# Patient Record
Sex: Female | Born: 1960 | Race: White | Hispanic: No | Marital: Married | Smoking: Former smoker
Health system: Southern US, Community
[De-identification: ages and names within clinical notes are randomized; demographics above are authoritative.]

## PROBLEM LIST (undated history)

## (undated) DIAGNOSIS — E66812 Obesity, class 2: Secondary | ICD-10-CM

## (undated) DIAGNOSIS — Z86010 Personal history of colonic polyps: Secondary | ICD-10-CM

## (undated) DIAGNOSIS — K219 Gastro-esophageal reflux disease without esophagitis: Secondary | ICD-10-CM

## (undated) DIAGNOSIS — E669 Obesity, unspecified: Secondary | ICD-10-CM

## (undated) DIAGNOSIS — Z860101 Personal history of adenomatous and serrated colon polyps: Secondary | ICD-10-CM

## (undated) DIAGNOSIS — E7800 Pure hypercholesterolemia, unspecified: Secondary | ICD-10-CM

## (undated) DIAGNOSIS — I82409 Acute embolism and thrombosis of unspecified deep veins of unspecified lower extremity: Secondary | ICD-10-CM

## (undated) DIAGNOSIS — E119 Type 2 diabetes mellitus without complications: Secondary | ICD-10-CM

## (undated) DIAGNOSIS — J45909 Unspecified asthma, uncomplicated: Secondary | ICD-10-CM

## (undated) HISTORY — DX: Personal history of colonic polyps: Z86.010

## (undated) HISTORY — PX: POLYPECTOMY: SHX149

## (undated) HISTORY — DX: Personal history of adenomatous and serrated colon polyps: Z86.0101

## (undated) HISTORY — DX: Unspecified asthma, uncomplicated: J45.909

## (undated) HISTORY — DX: Obesity, unspecified: E66.9

## (undated) HISTORY — DX: Obesity, class 2: E66.812

## (undated) HISTORY — PX: WISDOM TOOTH EXTRACTION: SHX21

## (undated) HISTORY — PX: KNEE ARTHROSCOPY: SUR90

## (undated) HISTORY — DX: Gastro-esophageal reflux disease without esophagitis: K21.9

## (undated) HISTORY — PX: CARPAL TUNNEL RELEASE: SHX101

## (undated) HISTORY — DX: Type 2 diabetes mellitus without complications: E11.9

## (undated) HISTORY — DX: Acute embolism and thrombosis of unspecified deep veins of unspecified lower extremity: I82.409

## (undated) HISTORY — PX: COLONOSCOPY: SHX174

## (undated) HISTORY — DX: Pure hypercholesterolemia, unspecified: E78.00

---

## 1999-04-10 ENCOUNTER — Other Ambulatory Visit: Admission: RE | Admit: 1999-04-10 | Discharge: 1999-04-10 | Payer: Self-pay | Admitting: Obstetrics and Gynecology

## 2000-08-06 ENCOUNTER — Other Ambulatory Visit: Admission: RE | Admit: 2000-08-06 | Discharge: 2000-08-06 | Payer: Self-pay | Admitting: Obstetrics and Gynecology

## 2001-06-04 ENCOUNTER — Ambulatory Visit (HOSPITAL_BASED_OUTPATIENT_CLINIC_OR_DEPARTMENT_OTHER): Admission: RE | Admit: 2001-06-04 | Discharge: 2001-06-04 | Payer: Self-pay | Admitting: Orthopedic Surgery

## 2002-03-16 ENCOUNTER — Other Ambulatory Visit: Admission: RE | Admit: 2002-03-16 | Discharge: 2002-03-16 | Payer: Self-pay | Admitting: Obstetrics and Gynecology

## 2009-05-12 ENCOUNTER — Ambulatory Visit: Payer: Self-pay | Admitting: Vascular Surgery

## 2010-01-22 DIAGNOSIS — I82409 Acute embolism and thrombosis of unspecified deep veins of unspecified lower extremity: Secondary | ICD-10-CM

## 2010-01-22 HISTORY — DX: Acute embolism and thrombosis of unspecified deep veins of unspecified lower extremity: I82.409

## 2010-06-09 NOTE — Op Note (Signed)
[place]  Patient:    YAHAIRA, BRUSKI Visit Number: [id number] MRN: [id number]          Service Type: DSU Location: [facility] Attending Physician:  Marlowe Shores Dictated by:   Artist Pais Mina Marble, M.D. Proc. Date: 06/04/01 Admit Date:  06/04/2001                             Operative Report  PREOPERATIVE DIAGNOSIS:  Right carpal tunnel syndrome.  POSTOPERATIVE DIAGNOSIS:  Right carpal tunnel syndrome.  PROCEDURE:  Right carpal tunnel release.  SURGEON:  Artist Pais. Mina Marble, M.D.  ANESTHESIA:  General.  TOURNIQUET TIME:  10 minutes.  COMPLICATIONS:  None.  DRAINS:  None.  DESCRIPTION OF PROCEDURE:  The patient was taken to the operating room, where after the induction of adequate general anesthesia the right upper extremity was prepped and draped in the usual sterile fashion.  An Esmarch was used to exsanguinate the limb and the tourniquet was inflated to 250 mmHg.  At this point in time a 2 cm incision was made in the palmar aspect of the right hand in line with the long finger metacarpal starting at _____ cardinal line.  The incision was taken down through the skin and subcutaneous tissues until the palmar fascia was identified.  The palmar fascia was split longitudinally, thus exposing the superficial palmar arch and the transverse carpal ligament. The palmar arch was retracted distally with a Ragnell retractor, and the distal edge of the transverse carpal ligament was divided with a 15 blade to expose the underlying median nerve and the contents of the carpal canal.  Next using a Therapist, nutritional, a path was created dorsal and volar to the remaining aspect of the transverse carpal ligament.  This was then divided under direct vision using scissors.  After it was decompressed, the wound was thoroughly irrigated.  Hemostasis was achieved with bipolar cautery.  It was closed with a running 3-0 Prolene subcuticular stitch.   Steri-Strips, 4 x 4s, fluffs, a compressive hand dressing were applied.  The patient tolerated the procedure well, went to the recovery room in stable fashion. Dictated by:   Artist Pais Mina Marble, M.D. Attending Physician:  Marlowe Shores DD:  06/04/01 TD:  06/05/01 Job: [id number]

## 2011-10-12 ENCOUNTER — Other Ambulatory Visit: Payer: Self-pay | Admitting: Obstetrics and Gynecology

## 2011-10-12 DIAGNOSIS — N6310 Unspecified lump in the right breast, unspecified quadrant: Secondary | ICD-10-CM

## 2011-10-15 ENCOUNTER — Other Ambulatory Visit: Payer: Self-pay

## 2011-10-16 ENCOUNTER — Ambulatory Visit
Admission: RE | Admit: 2011-10-16 | Discharge: 2011-10-16 | Disposition: A | Discharge status: Home / Self Care | Payer: 59 | Source: Ambulatory Visit | Attending: Obstetrics and Gynecology | Admitting: Obstetrics and Gynecology

## 2011-10-16 DIAGNOSIS — N6310 Unspecified lump in the right breast, unspecified quadrant: Secondary | ICD-10-CM

## 2012-01-23 HISTORY — PX: COLONOSCOPY: SHX174

## 2012-05-19 ENCOUNTER — Encounter: Payer: Self-pay | Admitting: Internal Medicine

## 2012-06-26 ENCOUNTER — Ambulatory Visit (AMBULATORY_SURGERY_CENTER): Payer: BC Managed Care – PPO | Admitting: *Deleted

## 2012-06-26 ENCOUNTER — Telehealth: Payer: Self-pay | Admitting: *Deleted

## 2012-06-26 ENCOUNTER — Encounter: Payer: Self-pay | Admitting: Internal Medicine

## 2012-06-26 VITALS — Ht 61.0 in | Wt 210.8 lb

## 2012-06-26 DIAGNOSIS — Z86010 Personal history of colonic polyps: Secondary | ICD-10-CM

## 2012-06-26 DIAGNOSIS — Z1211 Encounter for screening for malignant neoplasm of colon: Secondary | ICD-10-CM

## 2012-06-26 MED ORDER — MOVIPREP 100 G PO SOLR: 1.0000 | Freq: Once | ORAL | Status: DC

## 2012-06-26 NOTE — Progress Notes (Signed)
No egg or soy allergy. ewm No home oxygen use. ewm No problems with past sedations. ewm Pt states did have a colonoscopy 10 years ago in [place]. She did have polyps removed. Will obtain records. ewm

## 2012-06-26 NOTE — Telephone Encounter (Signed)
Patient states she had a colonoscopy 10 years ago at [place] [facility] with a doctor she cant remember the name of. She states she had several polyps removed at this colon and we will need records for dr pyrtle. Pt has a colon scheduled for Thursday 07-10-2012 at 0830 with dr pyrtle. Records release signed at [facility] today 06-26-12 and taken to stacy howald CMA. EWM,RN

## 2012-07-10 ENCOUNTER — Ambulatory Visit (AMBULATORY_SURGERY_CENTER): Payer: BC Managed Care – PPO | Admitting: Internal Medicine

## 2012-07-10 ENCOUNTER — Encounter: Payer: Self-pay | Admitting: Internal Medicine

## 2012-07-10 VITALS — BP 130/80 | HR 61 | Temp 98.1°F | Resp 17 | Ht 61.0 in | Wt 210.0 lb

## 2012-07-10 DIAGNOSIS — D126 Benign neoplasm of colon, unspecified: Secondary | ICD-10-CM

## 2012-07-10 DIAGNOSIS — Z86010 Personal history of colonic polyps: Secondary | ICD-10-CM

## 2012-07-10 DIAGNOSIS — Z1211 Encounter for screening for malignant neoplasm of colon: Secondary | ICD-10-CM

## 2012-07-10 MED ORDER — SODIUM CHLORIDE 0.9 % IV SOLN: 500.0000 mL | INTRAVENOUS | Status: DC

## 2012-07-10 NOTE — Progress Notes (Signed)
Lidocaine-40mg IV prior to Propofol InductionPropofol given over incremental dosages 

## 2012-07-10 NOTE — Progress Notes (Signed)
Patient did not experience any of the following events: a burn prior to discharge; a fall within the facility; wrong site/side/patient/procedure/implant event; or a hospital transfer or hospital admission upon discharge from the facility. (G8907) Patient did not have preoperative order for IV antibiotic SSI prophylaxis. (G8918)  

## 2012-07-10 NOTE — Patient Instructions (Addendum)
Discharge instructions given with verbal understanding. Handout on polyps given. Resume previous medications. YOU HAD AN ENDOSCOPIC PROCEDURE TODAY AT THE [facility]: Refer to the procedure report that was given to you for any specific questions about what was found during the examination.  If the procedure report does not answer your questions, please call your gastroenterologist to clarify.  If you requested that your care partner not be given the details of your procedure findings, then the procedure report has been included in a sealed envelope for you to review at your convenience later.  YOU SHOULD EXPECT: Some feelings of bloating in the abdomen. Passage of more gas than usual.  Walking can help get rid of the air that was put into your GI tract during the procedure and reduce the bloating. If you had a lower endoscopy (such as a colonoscopy or flexible sigmoidoscopy) you may notice spotting of blood in your stool or on the toilet paper. If you underwent a bowel prep for your procedure, then you may not have a normal bowel movement for a few days.  DIET: Your first meal following the procedure should be a light meal and then it is ok to progress to your normal diet.  A half-sandwich or bowl of soup is an example of a good first meal.  Heavy or fried foods are harder to digest and may make you feel nauseous or bloated.  Likewise meals heavy in dairy and vegetables can cause extra gas to form and this can also increase the bloating.  Drink plenty of fluids but you should avoid alcoholic beverages for 24 hours.  ACTIVITY: Your care partner should take you home directly after the procedure.  You should plan to take it easy, moving slowly for the rest of the day.  You can resume normal activity the day after the procedure however you should NOT DRIVE or use heavy machinery for 24 hours (because of the sedation medicines used during the test).    SYMPTOMS TO REPORT IMMEDIATELY: A  gastroenterologist can be reached at any hour.  During normal business hours, 8:30 AM to 5:00 PM Monday through Friday, call [phone].  After hours and on weekends, please call the GI answering service at [phone] who will take a message and have the physician on call contact you.   Following lower endoscopy (colonoscopy or flexible sigmoidoscopy):  Excessive amounts of blood in the stool  Significant tenderness or worsening of abdominal pains  Swelling of the abdomen that is new, acute  Fever of 100F or higher  FOLLOW UP: If any biopsies were taken you will be contacted by phone or by letter within the next 1-3 weeks.  Call your gastroenterologist if you have not heard about the biopsies in 3 weeks.  Our staff will call the home number listed on your records the next business day following your procedure to check on you and address any questions or concerns that you may have at that time regarding the information given to you following your procedure. This is a courtesy call and so if there is no answer at the home number and we have not heard from you through the emergency physician on call, we will assume that you have returned to your regular daily activities without incident.  SIGNATURES/CONFIDENTIALITY: You and/or your care partner have signed paperwork which will be entered into your electronic medical record.  These signatures attest to the fact that that the information above on your After Visit Summary has   been reviewed and is understood.  Full responsibility of the confidentiality of this discharge information lies with you and/or your care-partner. 

## 2012-07-10 NOTE — Telephone Encounter (Signed)
Pt had colon 07-10-12 with dr Rhea Belton. ewm

## 2012-07-10 NOTE — Op Note (Signed)
[facility] [address] [place], [address]   COLONOSCOPY PROCEDURE REPORT  PATIENT: Ernest, Orr  MR#: [id number] BIRTHDATE: [birth year] , 51  yrs. old GENDER: Female ENDOSCOPIST: Beverley Fiedler, MD REFERRED MW:[id number] Tisovec, M.D. PROCEDURE DATE:  07/10/2012 PROCEDURE:   Colonoscopy with snare polypectomy ASA CLASS:   Class II INDICATIONS:elevated risk screening and Last colonoscopy performed 10 years ago. MEDICATIONS: MAC sedation, administered by CRNA and propofol (Diprivan) 250mg  IV  DESCRIPTION OF PROCEDURE:   After the risks benefits and alternatives of the procedure were thoroughly explained, informed consent was obtained.  A digital rectal exam revealed no rectal mass.   The LB [id number]  endoscope was introduced through the anus and advanced to the terminal ileum which was intubated for a short distance. No adverse events experienced.   The quality of the prep was good, using MoviPrep  The instrument was then slowly withdrawn as the colon was fully examined.    COLON FINDINGS: The mucosa appeared normal in the terminal ileum. Five sessile polyps measuring 3-6 mm in size were found in the ascending colon (2), transverse colon (1), descending colon (1), and sigmoid colon (1).  Polypectomy was performed using cold snare. All resections were complete and all polyp tissue was completely retrieved.  Retroflexed views revealed external hemorrhoids. The time to cecum=2 minutes 08 seconds.  Withdrawal time=14 minutes 52 seconds.  The scope was withdrawn and the procedure completed  COMPLICATIONS: There were no complications.  ENDOSCOPIC IMPRESSION: 1.   Normal mucosa in the terminal ileum 2.   Five sessile polyps measuring 3-6 mm in size were found in the ascending colon, transverse colon, descending colon, and sigmoid colon; Polypectomy was performed using cold snare RECOMMENDATIONS: 1.  Await pathology results 2.  Hold aspirin,  aspirin products, and anti-inflammatory medication for 1 week. 3.  Timing of repeat colonoscopy will be determined by pathology findings (if all polyps adenomatous, then repeat in 3 years). 4.  You will receive a letter within 1-2 weeks with the results of your biopsy as well as final recommendations.  Please call my office if you have not received a letter after 3 weeks.   eSigned:  Beverley Fiedler, MD 07/10/2012 9:01 AM   cc: The Patient and Guerry Bruin, MD   PATIENT NAME:  Shiori, Adcox MR#: [id number]

## 2012-07-10 NOTE — Progress Notes (Signed)
Called to room to assist during endoscopic procedure.  Patient ID and intended procedure confirmed with present staff. Received instructions for my participation in the procedure from the performing physician.Called to room to assist during endoscopic procedure.  Patient ID and intended procedure confirmed with present staff. Received instructions for my participation in the procedure from the performing physician. 

## 2012-07-11 ENCOUNTER — Telehealth: Payer: Self-pay

## 2012-07-11 NOTE — Telephone Encounter (Signed)
  Follow up Call-  Call back number 07/10/2012  Post procedure Call Back phone  # [phone]  Permission to leave phone message Yes     Patient questions:  Do you have a fever, pain , or abdominal swelling? no Pain Score  0 *  Have you tolerated food without any problems? yes  Have you been able to return to your normal activities? yes  Do you have any questions about your discharge instructions: Diet   no Medications  no Follow up visit  no  Do you have questions or concerns about your Care? no  Actions: * If pain score is 4 or above: No action needed, pain <4.

## 2012-07-16 ENCOUNTER — Encounter: Payer: Self-pay | Admitting: Internal Medicine

## 2012-12-02 ENCOUNTER — Encounter: Payer: Self-pay | Admitting: Internal Medicine

## 2012-12-02 NOTE — Progress Notes (Signed)
Faxed fmla form to [place] [facility] [place] @ [phone].

## 2013-01-20 ENCOUNTER — Other Ambulatory Visit: Payer: Self-pay | Admitting: Obstetrics and Gynecology

## 2013-01-20 DIAGNOSIS — R928 Other abnormal and inconclusive findings on diagnostic imaging of breast: Secondary | ICD-10-CM

## 2013-02-03 ENCOUNTER — Ambulatory Visit
Admission: RE | Admit: 2013-02-03 | Discharge: 2013-02-03 | Disposition: A | Discharge status: Home / Self Care | Payer: BC Managed Care – PPO | Source: Ambulatory Visit | Attending: Obstetrics and Gynecology | Admitting: Obstetrics and Gynecology

## 2013-02-03 DIAGNOSIS — R928 Other abnormal and inconclusive findings on diagnostic imaging of breast: Secondary | ICD-10-CM

## 2013-06-29 DIAGNOSIS — J45909 Unspecified asthma, uncomplicated: Secondary | ICD-10-CM | POA: Insufficient documentation

## 2013-08-19 ENCOUNTER — Encounter: Payer: Self-pay | Admitting: Podiatrist

## 2013-08-19 ENCOUNTER — Ambulatory Visit (INDEPENDENT_AMBULATORY_CARE_PROVIDER_SITE_OTHER): Payer: BC Managed Care – PPO | Admitting: Podiatrist

## 2013-08-19 ENCOUNTER — Ambulatory Visit (INDEPENDENT_AMBULATORY_CARE_PROVIDER_SITE_OTHER): Payer: BC Managed Care – PPO

## 2013-08-19 VITALS — BP 145/86 | HR 64 | Resp 16 | Ht 61.0 in | Wt 217.0 lb

## 2013-08-19 DIAGNOSIS — M779 Enthesopathy, unspecified: Secondary | ICD-10-CM

## 2013-08-19 DIAGNOSIS — M79673 Pain in unspecified foot: Secondary | ICD-10-CM

## 2013-08-19 DIAGNOSIS — M722 Plantar fascial fibromatosis: Secondary | ICD-10-CM

## 2013-08-19 DIAGNOSIS — M79609 Pain in unspecified limb: Secondary | ICD-10-CM

## 2013-08-19 NOTE — Patient Instructions (Signed)

## 2013-08-19 NOTE — Progress Notes (Signed)
   Subjective:    Patient ID: Gail Vaughn, female    DOB: March 24, 1960, 53 y.o.   MRN: [id number]  HPI Comments: "I might have that plantar fasciitis"  Patient c/o sharp plantar/medial heel bilateral, left over right, for about 3 weeks. She has some swelling medially. She does not have AM pain. The more she is on them the worse it is. Tired massaging with frozen water bottle. No better.     Review of Systems  HENT: Positive for sinus pressure.   All other systems reviewed and are negative.      Objective:   Physical Exam GENERAL APPEARANCE: Alert, conversant. Appropriately groomed. No acute distress.  VASCULAR: Pedal pulses palpable and strong bilateral.  Capillary refill time is immediate to all digits,  Proximal to distal cooling it warm to warm.  Digital hair growth is present bilateral  NEUROLOGIC: sensation is intact epicritically and protectively to 5.07 monofilament at 5/5 sites bilateral.  Light touch is intact bilateral, vibratory sensation intact bilateral, achilles tendon reflex is intact bilateral.  Negative Tinel sign is elicited MUSCULOSKELETAL: Pain on palpation plantar medial aspect both feet  at insertion of plantar fascia on the medial calcaneal tubercle and along the midarch. Inflammation at the insertion of the plantar fascia is present. Rectus foot type is seen. DERMATOLOGIC: skin color, texture, and turger are within normal limits.  No preulcerative lesions are seen, no interdigital maceration noted.  No open lesions present.  Digital nails are asymptomatic.      Assessment & Plan:  Plantar fasciitis bilateral  Plan: Discussed the long-term benefits of orthotics and she was scanned at today's visit. We will notify her of her orthotic coverage prior to sending off the scan.  Dispensed stretching exercises discussed shoe gear changes and anti-inflammatories. I will see her back for followup when orthotics are ready for pick up.

## 2015-02-10 ENCOUNTER — Other Ambulatory Visit: Payer: Self-pay | Admitting: Obstetrics and Gynecology

## 2015-02-10 DIAGNOSIS — R928 Other abnormal and inconclusive findings on diagnostic imaging of breast: Secondary | ICD-10-CM

## 2015-02-16 ENCOUNTER — Ambulatory Visit
Admission: RE | Admit: 2015-02-16 | Discharge: 2015-02-16 | Disposition: A | Discharge status: Home / Self Care | Payer: Managed Care, Other (non HMO) | Source: Ambulatory Visit | Attending: Obstetrics and Gynecology | Admitting: Obstetrics and Gynecology

## 2015-02-16 DIAGNOSIS — R928 Other abnormal and inconclusive findings on diagnostic imaging of breast: Secondary | ICD-10-CM

## 2015-05-26 ENCOUNTER — Encounter: Payer: Self-pay | Admitting: Internal Medicine

## 2015-08-08 DIAGNOSIS — M65971 Unspecified synovitis and tenosynovitis, right ankle and foot: Secondary | ICD-10-CM | POA: Insufficient documentation

## 2015-08-08 DIAGNOSIS — M659 Synovitis and tenosynovitis, unspecified: Secondary | ICD-10-CM | POA: Insufficient documentation

## 2015-08-08 DIAGNOSIS — M19072 Primary osteoarthritis, left ankle and foot: Secondary | ICD-10-CM | POA: Insufficient documentation

## 2016-04-26 ENCOUNTER — Other Ambulatory Visit: Payer: Self-pay | Admitting: Obstetrics and Gynecology

## 2016-04-26 DIAGNOSIS — Z1231 Encounter for screening mammogram for malignant neoplasm of breast: Secondary | ICD-10-CM

## 2016-05-15 ENCOUNTER — Ambulatory Visit
Admission: RE | Admit: 2016-05-15 | Discharge: 2016-05-15 | Disposition: A | Discharge status: Home / Self Care | Payer: Managed Care, Other (non HMO) | Source: Ambulatory Visit | Attending: Obstetrics and Gynecology | Admitting: Obstetrics and Gynecology

## 2016-05-15 ENCOUNTER — Encounter: Payer: Self-pay | Admitting: Radiology

## 2016-05-15 DIAGNOSIS — Z1231 Encounter for screening mammogram for malignant neoplasm of breast: Secondary | ICD-10-CM

## 2017-04-09 ENCOUNTER — Encounter: Payer: Self-pay | Admitting: Internal Medicine

## 2017-04-09 ENCOUNTER — Other Ambulatory Visit: Payer: Self-pay | Admitting: Obstetrics and Gynecology

## 2017-04-09 DIAGNOSIS — Z1231 Encounter for screening mammogram for malignant neoplasm of breast: Secondary | ICD-10-CM

## 2017-05-20 ENCOUNTER — Ambulatory Visit
Admission: RE | Admit: 2017-05-20 | Discharge: 2017-05-20 | Disposition: A | Discharge status: Home / Self Care | Payer: Managed Care, Other (non HMO) | Source: Ambulatory Visit | Attending: Obstetrics and Gynecology | Admitting: Obstetrics and Gynecology

## 2017-05-20 DIAGNOSIS — Z1231 Encounter for screening mammogram for malignant neoplasm of breast: Secondary | ICD-10-CM

## 2017-06-05 ENCOUNTER — Other Ambulatory Visit: Payer: Self-pay

## 2017-06-05 ENCOUNTER — Ambulatory Visit (AMBULATORY_SURGERY_CENTER): Payer: Self-pay | Admitting: *Deleted

## 2017-06-05 VITALS — Ht 61.0 in | Wt 201.0 lb

## 2017-06-05 DIAGNOSIS — Z860100 Personal history of colon polyps, unspecified: Secondary | ICD-10-CM

## 2017-06-05 MED ORDER — NA SULFATE-K SULFATE-MG SULF 17.5-3.13-1.6 GM/177ML PO SOLN: ORAL | 0 refills | Status: DC

## 2017-06-05 NOTE — Progress Notes (Signed)
Patient denies any allergies to eggs or soy. Patient denies any problems with anesthesia/sedation. Patient denies any oxygen use at home. Patient denies taking any diet/weight loss medications or blood thinners. EMMI education declined by the pt.  

## 2017-06-10 ENCOUNTER — Encounter: Payer: Self-pay | Admitting: Internal Medicine

## 2017-06-19 ENCOUNTER — Other Ambulatory Visit: Payer: Self-pay

## 2017-06-19 ENCOUNTER — Ambulatory Visit (AMBULATORY_SURGERY_CENTER): Payer: Managed Care, Other (non HMO) | Admitting: Internal Medicine

## 2017-06-19 ENCOUNTER — Encounter: Payer: Self-pay | Admitting: Internal Medicine

## 2017-06-19 VITALS — BP 131/87 | HR 61 | Temp 98.6°F | Resp 13 | Ht 61.0 in | Wt 201.0 lb

## 2017-06-19 DIAGNOSIS — K635 Polyp of colon: Secondary | ICD-10-CM | POA: Diagnosis not present

## 2017-06-19 DIAGNOSIS — D124 Benign neoplasm of descending colon: Secondary | ICD-10-CM

## 2017-06-19 DIAGNOSIS — Z86010 Personal history of colonic polyps: Secondary | ICD-10-CM | POA: Diagnosis present

## 2017-06-19 DIAGNOSIS — D122 Benign neoplasm of ascending colon: Secondary | ICD-10-CM | POA: Diagnosis not present

## 2017-06-19 LAB — HM COLONOSCOPY

## 2017-06-19 MED ORDER — SODIUM CHLORIDE 0.9 % IV SOLN: 500.0000 mL | Freq: Once | INTRAVENOUS | Status: DC

## 2017-06-19 NOTE — Progress Notes (Signed)
Called to room to assist during endoscopic procedure.  Patient ID and intended procedure confirmed with present staff. Received instructions for my participation in the procedure from the performing physician.  

## 2017-06-19 NOTE — Progress Notes (Signed)
Report to PACU, RN, vss, BBS= Clear.  

## 2017-06-19 NOTE — Patient Instructions (Signed)
   INFORMATION ON POLYPS AND HEMORRHOIDS GIVEN TO YOU TODAY  AWAIT PATHOLOGY RESULTS ON POLYPS REMOVED    YOU HAD AN ENDOSCOPIC PROCEDURE TODAY AT THE [facility]:   Refer to the procedure report that was given to you for any specific questions about what was found during the examination.  If the procedure report does not answer your questions, please call your gastroenterologist to clarify.  If you requested that your care partner not be given the details of your procedure findings, then the procedure report has been included in a sealed envelope for you to review at your convenience later.  YOU SHOULD EXPECT: Some feelings of bloating in the abdomen. Passage of more gas than usual.  Walking can help get rid of the air that was put into your GI tract during the procedure and reduce the bloating. If you had a lower endoscopy (such as a colonoscopy or flexible sigmoidoscopy) you may notice spotting of blood in your stool or on the toilet paper. If you underwent a bowel prep for your procedure, you may not have a normal bowel movement for a few days.  Please Note:  You might notice some irritation and congestion in your nose or some drainage.  This is from the oxygen used during your procedure.  There is no need for concern and it should clear up in a day or so.  SYMPTOMS TO REPORT IMMEDIATELY:   Following lower endoscopy (colonoscopy or flexible sigmoidoscopy):  Excessive amounts of blood in the stool  Significant tenderness or worsening of abdominal pains  Swelling of the abdomen that is new, acute  Fever of 100F or higher    For urgent or emergent issues, a gastroenterologist can be reached at any hour by calling [phone].   DIET:  We do recommend a small meal at first, but then you may proceed to your regular diet.  Drink plenty of fluids but you should avoid alcoholic beverages for 24 hours.  ACTIVITY:  You should plan to take it easy for the rest of today and you  should NOT DRIVE or use heavy machinery until tomorrow (because of the sedation medicines used during the test).    FOLLOW UP: Our staff will call the number listed on your records the next business day following your procedure to check on you and address any questions or concerns that you may have regarding the information given to you following your procedure. If we do not reach you, we will leave a message.  However, if you are feeling well and you are not experiencing any problems, there is no need to return our call.  We will assume that you have returned to your regular daily activities without incident.  If any biopsies were taken you will be contacted by phone or by letter within the next 1-3 weeks.  Please call us at [phone] if you have not heard about the biopsies in 3 weeks.    SIGNATURES/CONFIDENTIALITY: You and/or your care partner have signed paperwork which will be entered into your electronic medical record.  These signatures attest to the fact that that the information above on your After Visit Summary has been reviewed and is understood.  Full responsibility of the confidentiality of this discharge information lies with you and/or your care-partner. 

## 2017-06-19 NOTE — Progress Notes (Signed)
Pt's states no medical or surgical changes since previsit or office visit. 

## 2017-06-19 NOTE — Op Note (Signed)
[place] Patient Name: Gail Vaughn Procedure Date: 06/19/2017 9:03 AM MRN: [id number] Endoscopist: Jerene Bears , MD Age: 57 Referring MD:  Date of Birth: [birth year] Gender: Female Account #: [id number] Procedure:                Colonoscopy Indications:              Surveillance: Personal history of adenomatous                            polyps/SSP on last colonoscopy 5 years ago Medicines:                Monitored Anesthesia Care Procedure:                Pre-Anesthesia Assessment:                           - Prior to the procedure, a History and Physical                            was performed, and patient medications and                            allergies were reviewed. The patient's tolerance of                            previous anesthesia was also reviewed. The risks                            and benefits of the procedure and the sedation                            options and risks were discussed with the patient.                            All questions were answered, and informed consent                            was obtained. Prior Anticoagulants: The patient has                            taken no previous anticoagulant or antiplatelet                            agents. ASA Grade Assessment: II - A patient with                            mild systemic disease. After reviewing the risks                            and benefits, the patient was deemed in                            satisfactory condition to undergo the procedure.  After obtaining informed consent, the colonoscope                            was passed under direct vision. Throughout the                            procedure, the patient's blood pressure, pulse, and                            oxygen saturations were monitored continuously. The                            Colonoscope was introduced through the anus and                            advanced to the  cecum, identified by appendiceal                            orifice and ileocecal valve. The colonoscopy was                            performed without difficulty. The patient tolerated                            the procedure well. The quality of the bowel                            preparation was good. The ileocecal valve,                            appendiceal orifice, and rectum were photographed. Scope In: 9:08:53 AM Scope Out: 9:26:30 AM Scope Withdrawal Time: 0 hours 16 minutes 4 seconds  Total Procedure Duration: 0 hours 17 minutes 37 seconds  Findings:                 The digital rectal exam was normal.                           A 3 mm polyp was found in the ascending colon. The                            polyp was sessile. The polyp was removed with a                            cold snare. Resection and retrieval were complete.                           A 5 mm polyp was found in the descending colon. The                            polyp was sessile. The polyp was removed with a  cold snare. Resection and retrieval were complete.                           A 4 mm polyp was found in the descending colon. The                            polyp was sessile. The polyp was removed with a                            cold biopsy forceps due to difficult positioning                            with the snare. Resection and retrieval were                            complete.                           Internal hemorrhoids were found during                            retroflexion. The hemorrhoids were small. Complications:            No immediate complications. Estimated Blood Loss:     Estimated blood loss was minimal. Impression:               - One 3 mm polyp in the ascending colon, removed                            with a cold snare. Resected and retrieved.                           - One 5 mm polyp in the descending colon, removed                             with a cold snare. Resected and retrieved.                           - One 4 mm polyp in the descending colon, removed                            with a cold biopsy forceps. Resected and retrieved.                           - Small internal hemorrhoids. Recommendation:           - Patient has a contact number available for                            emergencies. The signs and symptoms of potential                            delayed complications were discussed with the  patient. Return to normal activities tomorrow.                            Written discharge instructions were provided to the                            patient.                           - Resume previous diet.                           - Continue present medications.                           - Await pathology results.                           - Repeat colonoscopy date to be determined after                            pending pathology results are reviewed for                            surveillance. Jerene Bears, MD 06/19/2017 9:34:42 AM This report has been signed electronically.

## 2017-06-20 ENCOUNTER — Telehealth: Payer: Self-pay | Admitting: *Deleted

## 2017-06-20 NOTE — Telephone Encounter (Signed)
  Follow up Call-  Call back number 06/19/2017  Post procedure Call Back phone  # [phone]  Permission to leave phone message Yes  Some recent data might be hidden     Patient questions:  Do you have a fever, pain , or abdominal swelling? No. Pain Score  0 *  Have you tolerated food without any problems? Yes.    Have you been able to return to your normal activities? Yes.    Do you have any questions about your discharge instructions: Diet   No. Medications  No. Follow up visit  No.  Do you have questions or concerns about your Care? No.  Actions: * If pain score is 4 or above: No action needed, pain <4.

## 2017-06-23 ENCOUNTER — Encounter: Payer: Self-pay | Admitting: Internal Medicine

## 2017-09-30 DIAGNOSIS — M79671 Pain in right foot: Secondary | ICD-10-CM | POA: Insufficient documentation

## 2018-03-12 LAB — TSH: TSH: 1.95 (ref 0.41–5.90)

## 2018-03-12 LAB — COMPREHENSIVE METABOLIC PANEL: Albumin: 4.2 (ref 3.5–5.0)

## 2018-03-12 LAB — BASIC METABOLIC PANEL
BUN: 14 (ref 4–21)
Creatinine: 0.8 (ref 0.5–1.1)
Glucose: 127
Sodium: 142 (ref 137–147)

## 2018-03-12 LAB — LIPID PANEL
Cholesterol: 161 (ref 0–200)
HDL: 50 (ref 35–70)
LDL Cholesterol: 95
Triglycerides: 81 (ref 40–160)

## 2018-03-12 LAB — HEMOGLOBIN A1C
Hemoglobin A1C: 6.7
Hemoglobin A1C: 6.7

## 2018-03-12 LAB — HM DIABETES FOOT EXAM

## 2018-03-12 LAB — CBC: RBC: 4.8 (ref 3.87–5.11)

## 2018-03-12 LAB — HEPATIC FUNCTION PANEL
Alkaline Phosphatase: 52 (ref 25–125)
Bilirubin, Total: 0.8

## 2018-03-12 LAB — CBC AND DIFFERENTIAL
Hemoglobin: 13.8 (ref 12.0–16.0)
Platelets: 270 (ref 150–399)
WBC: 6.5

## 2018-03-12 LAB — MICROALBUMIN, URINE: Microalb, Ur: 5

## 2018-06-24 ENCOUNTER — Other Ambulatory Visit: Payer: Self-pay | Admitting: Obstetrics and Gynecology

## 2018-06-24 DIAGNOSIS — Z1231 Encounter for screening mammogram for malignant neoplasm of breast: Secondary | ICD-10-CM

## 2018-08-14 ENCOUNTER — Other Ambulatory Visit: Payer: Self-pay

## 2018-08-14 ENCOUNTER — Ambulatory Visit
Admission: RE | Admit: 2018-08-14 | Discharge: 2018-08-14 | Disposition: A | Discharge status: Home / Self Care | Payer: Managed Care, Other (non HMO) | Source: Ambulatory Visit | Attending: Obstetrics and Gynecology | Admitting: Obstetrics and Gynecology

## 2018-08-14 DIAGNOSIS — Z1231 Encounter for screening mammogram for malignant neoplasm of breast: Secondary | ICD-10-CM

## 2018-08-14 LAB — HM MAMMOGRAPHY

## 2018-10-09 LAB — HM DIABETES EYE EXAM

## 2018-10-23 ENCOUNTER — Ambulatory Visit: Payer: Managed Care, Other (non HMO) | Admitting: Family Medicine

## 2018-11-13 ENCOUNTER — Other Ambulatory Visit: Payer: Self-pay

## 2018-11-14 ENCOUNTER — Ambulatory Visit: Payer: Managed Care, Other (non HMO) | Admitting: Family Medicine

## 2018-11-18 ENCOUNTER — Encounter: Payer: Self-pay | Admitting: Family Medicine

## 2018-11-21 ENCOUNTER — Ambulatory Visit: Payer: Managed Care, Other (non HMO) | Admitting: Family Medicine

## 2018-11-21 ENCOUNTER — Other Ambulatory Visit: Payer: Self-pay

## 2018-11-21 ENCOUNTER — Encounter: Payer: Self-pay | Admitting: Family Medicine

## 2018-11-21 VITALS — BP 132/84 | HR 62 | Temp 98.0°F | Resp 16 | Ht 61.0 in | Wt 198.4 lb

## 2018-11-21 DIAGNOSIS — E7800 Pure hypercholesterolemia, unspecified: Secondary | ICD-10-CM

## 2018-11-21 DIAGNOSIS — E119 Type 2 diabetes mellitus without complications: Secondary | ICD-10-CM | POA: Diagnosis not present

## 2018-11-21 LAB — CBC WITH DIFFERENTIAL/PLATELET
Basophils Absolute: 0 10*3/uL (ref 0.0–0.1)
Basophils Relative: 0.3 % (ref 0.0–3.0)
Eosinophils Absolute: 0.2 10*3/uL (ref 0.0–0.7)
Eosinophils Relative: 3.3 % (ref 0.0–5.0)
HCT: 42.1 % (ref 36.0–46.0)
Hemoglobin: 13.9 g/dL (ref 12.0–15.0)
Lymphocytes Relative: 39.4 % (ref 12.0–46.0)
Lymphs Abs: 2.4 10*3/uL (ref 0.7–4.0)
MCHC: 32.9 g/dL (ref 30.0–36.0)
MCV: 88.6 fl (ref 78.0–100.0)
Monocytes Absolute: 0.3 10*3/uL (ref 0.1–1.0)
Monocytes Relative: 5.1 % (ref 3.0–12.0)
Neutro Abs: 3.2 10*3/uL (ref 1.4–7.7)
Neutrophils Relative %: 51.9 % (ref 43.0–77.0)
Platelets: 280 10*3/uL (ref 150.0–400.0)
RBC: 4.76 Mil/uL (ref 3.87–5.11)
RDW: 13.3 % (ref 11.5–15.5)
WBC: 6.2 10*3/uL (ref 4.0–10.5)

## 2018-11-21 LAB — COMPREHENSIVE METABOLIC PANEL
ALT: 20 U/L (ref 0–35)
AST: 15 U/L (ref 0–37)
Albumin: 4.6 g/dL (ref 3.5–5.2)
Alkaline Phosphatase: 54 U/L (ref 39–117)
BUN: 20 mg/dL (ref 6–23)
CO2: 28 mEq/L (ref 19–32)
Calcium: 9.8 mg/dL (ref 8.4–10.5)
Chloride: 105 mEq/L (ref 96–112)
Creatinine, Ser: 0.71 mg/dL (ref 0.40–1.20)
GFR: 84.86 mL/min (ref >60.00)
Glucose, Bld: 137 mg/dL — ABNORMAL HIGH (ref 70–99)
Potassium: 4.5 mEq/L (ref 3.5–5.1)
Sodium: 141 mEq/L (ref 135–145)
Total Bilirubin: 0.8 mg/dL (ref 0.2–1.2)
Total Protein: 6.9 g/dL (ref 6.0–8.3)

## 2018-11-21 LAB — LIPID PANEL
Cholesterol: 186 mg/dL (ref 0–200)
HDL: 57.2 mg/dL (ref >39.00)
LDL Cholesterol: 107 mg/dL — ABNORMAL HIGH (ref 0–99)
NonHDL: 129.29
Total CHOL/HDL Ratio: 3
Triglycerides: 109 mg/dL (ref 0.0–149.0)
VLDL: 21.8 mg/dL (ref 0.0–40.0)

## 2018-11-21 LAB — HEMOGLOBIN A1C: Hgb A1c MFr Bld: 7.2 % — ABNORMAL HIGH (ref 4.6–6.5)

## 2018-11-21 LAB — TSH: TSH: 1.62 u[IU]/mL (ref 0.35–4.50)

## 2018-11-21 MED ORDER — PRAVASTATIN SODIUM 20 MG PO TABS: 20.0000 mg | ORAL_TABLET | Freq: Every day | ORAL | 3 refills | Status: DC

## 2018-11-21 NOTE — Progress Notes (Signed)
Office Note 11/21/2018  CC:  Chief Complaint  Patient presents with  . Establish Care    Previous PCP, Dr.Tisovec    HPI:  Gail Vaughn is a 58 y.o. White female who is here to establish care and f/u hyperlipidemia and DM. Patient's most recent primary MD: Dr. Osborne Casco. Old records in EPIC/HL EMR were reviewed prior to or during today's visit.  GYN MD->Dr. Gaetano Net.  Most recent CPE info brought in today, dated 09/17/17.  Has had only coffee with a bit of creamer today. Not dieting or exercising.  HLD: tolerating daily pravastatin.  DM 2: not doing very well with diet the last 12-18 mo->too busy building a home/relocating. She does not exercise.     Past Medical History:  Diagnosis Date  . Diabetes mellitus without complication ([place])    controlled- no meds-pt lose weight   . DVT (deep venous thrombosis) ([place]) 2012   coumadin  . GERD (gastroesophageal reflux disease)   . History of adenomatous polyp of colon 2014, 2019   Recall 2024 (Dr. Hilarie Fredrickson).  . Obesity, Class II, BMI 35-39.9     Past Surgical History:  Procedure Laterality Date  . CARPAL TUNNEL RELEASE     bilateral  . COLONOSCOPY  2014; 05/2017   Recall 5 yrs.  Marland Kitchen KNEE ARTHROSCOPY     20 years ago  . POLYPECTOMY    . WISDOM TOOTH EXTRACTION     age 56    Family History  Problem Relation Age of Onset  . Lung cancer Mother   . Breast cancer Cousin   . Esophageal cancer Cousin   . Colon cancer Neg Hx   . Stomach cancer Neg Hx     Social History   Socioeconomic History  . Marital status: Married    Spouse name: Not on file  . Number of children: Not on file  . Years of education: Not on file  . Highest education level: Not on file  Occupational History  . Not on file  Social Needs  . Financial resource strain: Not on file  . Food insecurity    Worry: Not on file    Inability: Not on file  . Transportation needs    Medical: Not on file    Non-medical: Not on file  Tobacco Use  .  Smoking status: Former Research scientist (life sciences)  . Smokeless tobacco: Never Used  Substance and Sexual Activity  . Alcohol use: Yes    Comment: occasionally once every 2-3 months per pt  . Drug use: No  . Sexual activity: Not on file  Lifestyle  . Physical activity    Days per week: Not on file    Minutes per session: Not on file  . Stress: Not on file  Relationships  . Social Herbalist on phone: Not on file    Gets together: Not on file    Attends religious service: Not on file    Active member of club or organization: Not on file    Attends meetings of clubs or organizations: Not on file    Relationship status: Not on file  . Intimate partner violence    Fear of current or ex partner: Not on file    Emotionally abused: Not on file    Physically abused: Not on file    Forced sexual activity: Not on file  Other Topics Concern  . Not on file  Social History Narrative   Married Ronalee Belts), one son Jenny Reichmann).   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   No T/A/Ds.    Outpatient Encounter Medications as of 11/21/2018  Medication Sig  . Cholecalciferol (VITAMIN D3 PO) Take by mouth daily.  . Cyanocobalamin (VITAMIN B 12 PO) Take by mouth.  . fish oil-omega-3 fatty acids 1000 MG capsule Take 2 g by mouth daily.  . pravastatin (PRAVACHOL) 20 MG tablet Take 1 tablet (20 mg total) by mouth daily.  . [DISCONTINUED] pravastatin (PRAVACHOL) 20 MG tablet Take 1 tablet by mouth daily.  . meloxicam (MOBIC) 15 MG tablet Take 15 mg by mouth daily.  . Multiple Vitamins-Minerals (MULTIVITAMIN PO) Take 1 capsule by mouth daily.   Facility-Administered Encounter Medications as of 11/21/2018  Medication  . 0.9 %  sodium chloride infusion    Allergies  Allergen Reactions  . Naproxen Sodium Hives    Hives, itching, vomiting  . Nsaids   . Percocet [Oxycodone-Acetaminophen] Nausea And Vomiting  . Statins     ROS Review of Systems  Constitutional: Negative for fatigue and fever.  HENT:  Negative for congestion and sore throat.   Eyes: Negative for visual disturbance.  Respiratory: Negative for cough.   Cardiovascular: Negative for chest pain.  Gastrointestinal: Negative for abdominal pain and nausea.  Genitourinary: Negative for dysuria.  Musculoskeletal: Negative for back pain and joint swelling.  Skin: Negative for rash.  Neurological: Negative for weakness and headaches.  Hematological: Negative for adenopathy.    PE; Blood pressure 132/84, pulse 62, temperature 98 F (36.7 C), temperature source Temporal, resp. rate 16, height 5\' 1"  (1.549 m), weight 198 lb 6.4 oz (90 kg), last menstrual period 10/23/2011, SpO2 97 %. Body mass index is 37.49 kg/m.  Gen: Alert, well appearing.  Patient is oriented to person, place, time, and situation. AFFECT: pleasant, lucid thought and speech. CV: RRR, no m/r/g.   LUNGS: CTA bilat, nonlabored resps, good aeration in all lung fields. EXT: no clubbing or cyanosis.  no edema.   Pertinent labs:  None  ASSESSMENT AND PLAN:   New pt:  1) Hypercholesterolemia.  Tolerating statin. FLP and CMET today.  2) DM 2, diet controlled. Needs to get back on more strict low carb/low fat diet and get started with exercise/wt loss. HbA1c, lytes, CBC, TSH today.   An After Visit Summary was printed and given to the patient.  Return in about 1 year (around 11/21/2019) for annual CPE (fasting).  Signed:  Crissie Sickles, MD           11/21/2018

## 2018-11-24 ENCOUNTER — Other Ambulatory Visit: Payer: Self-pay | Admitting: Family Medicine

## 2018-11-24 DIAGNOSIS — E119 Type 2 diabetes mellitus without complications: Secondary | ICD-10-CM

## 2018-11-25 LAB — HM DIABETES FOOT EXAM

## 2018-12-04 ENCOUNTER — Encounter: Payer: Self-pay | Admitting: Family Medicine

## 2019-01-23 DIAGNOSIS — S00411A Abrasion of right ear, initial encounter: Secondary | ICD-10-CM

## 2019-01-23 HISTORY — DX: Abrasion of right ear, initial encounter: S00.411A

## 2019-02-02 ENCOUNTER — Encounter: Payer: Self-pay | Admitting: Family Medicine

## 2019-02-03 ENCOUNTER — Other Ambulatory Visit: Payer: Self-pay

## 2019-02-03 MED ORDER — PRAVASTATIN SODIUM 20 MG PO TABS: 20.0000 mg | ORAL_TABLET | Freq: Every day | ORAL | 2 refills | Status: DC

## 2019-03-11 ENCOUNTER — Other Ambulatory Visit: Payer: Self-pay

## 2019-03-11 ENCOUNTER — Ambulatory Visit: Payer: Managed Care, Other (non HMO) | Admitting: Family Medicine

## 2019-03-11 ENCOUNTER — Encounter: Payer: Self-pay | Admitting: Family Medicine

## 2019-03-11 VITALS — BP 134/88 | HR 75 | Temp 98.3°F | Resp 16 | Ht 61.0 in | Wt 196.4 lb

## 2019-03-11 DIAGNOSIS — H6691 Otitis media, unspecified, right ear: Secondary | ICD-10-CM | POA: Diagnosis not present

## 2019-03-11 DIAGNOSIS — S0921XA Traumatic rupture of right ear drum, initial encounter: Secondary | ICD-10-CM | POA: Diagnosis not present

## 2019-03-11 DIAGNOSIS — H9201 Otalgia, right ear: Secondary | ICD-10-CM | POA: Diagnosis not present

## 2019-03-11 MED ORDER — AMOXICILLIN 875 MG PO TABS: 875.0000 mg | ORAL_TABLET | Freq: Two times a day (BID) | ORAL | 0 refills | Status: AC

## 2019-03-11 MED ORDER — CIPROFLOXACIN-FLUOCINOLONE PF 0.3-0.025 % OT SOLN: 0.2500 mL | Freq: Two times a day (BID) | OTIC | 0 refills | Status: DC

## 2019-03-11 NOTE — Progress Notes (Signed)
OFFICE VISIT  03/11/2019   CC:  Chief Complaint  Patient presents with  . Ear Pain    right, x 2 days.    HPI:    Patient is a 59 y.o. Caucasian female who presents for ear pain. She hurt her right ear with a Q-tip about 4 d/a and noted a little bit of blood initially then it has drained some clearish fluid since then.  No URI or allergy sx's lately.  Past Medical History:  Diagnosis Date  . Diabetes mellitus without complication ([place])    controlled- no meds-pt lose weight   . DVT (deep venous thrombosis) ([place]) 2012   coumadin  . GERD (gastroesophageal reflux disease)   . History of adenomatous polyp of colon 2014, 2019   Recall 2024 (Dr. Hilarie Fredrickson).  . Hypercholesterolemia   . Obesity, Class II, BMI 35-39.9     Past Surgical History:  Procedure Laterality Date  . CARPAL TUNNEL RELEASE     bilateral  . COLONOSCOPY  2014; 05/2017   Recall 5 yrs.  Marland Kitchen KNEE ARTHROSCOPY     20 years ago  . POLYPECTOMY    . WISDOM TOOTH EXTRACTION     age 63   Social History   Socioeconomic History  . Marital status: Married    Spouse name: Not on file  . Number of children: Not on file  . Years of education: Not on file  . Highest education level: Not on file  Occupational History  . Not on file  Tobacco Use  . Smoking status: Former Research scientist (life sciences)  . Smokeless tobacco: Never Used  Substance and Sexual Activity  . Alcohol use: Yes    Comment: occasionally once every 2-3 months per pt  . Drug use: No  . Sexual activity: Not on file  Other Topics Concern  . Not on file  Social History Narrative   Married Ronalee Belts), one son Jenny Reichmann).   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   No T/A/Ds.   Social Determinants of Health   Financial Resource Strain:   . Difficulty of Paying Living Expenses: Not on file  Food Insecurity:   . Worried About Charity fundraiser in the Last Year: Not on file  . Ran Out of Food in the Last Year: Not on file  Transportation Needs:   . Lack of  Transportation (Medical): Not on file  . Lack of Transportation (Non-Medical): Not on file  Physical Activity:   . Days of Exercise per Week: Not on file  . Minutes of Exercise per Session: Not on file  Stress:   . Feeling of Stress : Not on file  Social Connections:   . Frequency of Communication with Friends and Family: Not on file  . Frequency of Social Gatherings with Friends and Family: Not on file  . Attends Religious Services: Not on file  . Active Member of Clubs or Organizations: Not on file  . Attends Archivist Meetings: Not on file  . Marital Status: Not on file    Outpatient Medications Prior to Visit  Medication Sig Dispense Refill  . Cholecalciferol (VITAMIN D3 PO) Take by mouth daily.    . Cyanocobalamin (VITAMIN B 12 PO) Take by mouth.    . fish oil-omega-3 fatty acids 1000 MG capsule Take 2 g by mouth daily.    . Multiple Vitamins-Minerals (MULTIVITAMIN PO) Take 1 capsule by mouth daily.    . pravastatin (PRAVACHOL) 20 MG tablet Take 1 tablet (20 mg total) by  mouth daily. 90 tablet 2  . meloxicam (MOBIC) 15 MG tablet Take 15 mg by mouth daily.     Facility-Administered Medications Prior to Visit  Medication Dose Route Frequency Provider Last Rate Last Admin  . 0.9 %  sodium chloride infusion  500 mL Intravenous Once Pyrtle, Lajuan Lines, MD        Allergies  Allergen Reactions  . Naproxen Sodium Hives    Hives, itching, vomiting  . Nsaids   . Percocet [Oxycodone-Acetaminophen] Nausea And Vomiting    ROS As per HPI  PE: Blood pressure 134/88, pulse 75, temperature 98.3 F (36.8 C), temperature source Temporal, resp. rate 16, height 5\' 1"  (1.549 m), weight 196 lb 6.4 oz (89.1 kg), last menstrual period 10/23/2011, SpO2 98 %. Gen: Alert, well appearing.  Patient is oriented to person, place, time, and situation. AFFECT: pleasant, lucid thought and speech. L EAC full of cerumen, cannot see the TM R EAC with trace edema but no erythema.  Some clear fluid  in distal canal with yellow debris vs pus. I cannot discern any TM on R. This debris is either IN the middle ear or abutting the TM (completely obscuring view of TM). External ear anatomy w/out swelling, erythema, or tenderness.  LABS:    Chemistry      Component Value Date/Time   NA 141 11/21/2018 1010   NA 142 03/12/2018 0000   K 4.5 11/21/2018 1010   CL 105 11/21/2018 1010   CO2 28 11/21/2018 1010   BUN 20 11/21/2018 1010   BUN 14 03/12/2018 0000   CREATININE 0.71 11/21/2018 1010   GLU 127 03/12/2018 0000      Component Value Date/Time   CALCIUM 9.8 11/21/2018 1010   ALKPHOS 54 11/21/2018 1010   AST 15 11/21/2018 1010   ALT 20 11/21/2018 1010   BILITOT 0.8 11/21/2018 1010     Lab Results  Component Value Date   HGBA1C 7.2 (H) 11/21/2018   Lab Results  Component Value Date   CHOL 186 11/21/2018   HDL 57.20 11/21/2018   LDLCALC 107 (H) 11/21/2018   TRIG 109.0 11/21/2018   CHOLHDL 3 11/21/2018   Lab Results  Component Value Date   TSH 1.62 11/21/2018   Lab Results  Component Value Date   WBC 6.2 11/21/2018   HGB 13.9 11/21/2018   HCT 42.1 11/21/2018   MCV 88.6 11/21/2018   PLT 280.0 11/21/2018   IMPRESSION AND PLAN:  1) R otalgia s/p trauma from Q tip use. Suspect traumatic TM rupture with subsequent OM. Will treat with cipro otic 0.3ml in R ear bid x 7d.  Also amoxil 875mg  bid x 7d. Recheck ear in 7d.  2) DM 2, diet controlled. She declined recheck of her A1c today,stating she wanted to continue to work on TLC at this time.  An After Visit Summary was printed and given to the patient.  FOLLOW UP: Return in about 1 week (around 03/18/2019) for recheck R ear.  Signed:  Crissie Sickles, MD           03/11/2019

## 2019-03-20 ENCOUNTER — Ambulatory Visit (INDEPENDENT_AMBULATORY_CARE_PROVIDER_SITE_OTHER): Payer: Managed Care, Other (non HMO) | Admitting: Family Medicine

## 2019-03-20 ENCOUNTER — Other Ambulatory Visit: Payer: Self-pay

## 2019-03-20 ENCOUNTER — Encounter: Payer: Self-pay | Admitting: Family Medicine

## 2019-03-20 VITALS — BP 135/84 | HR 67 | Temp 98.4°F | Resp 16 | Ht 61.0 in | Wt 197.0 lb

## 2019-03-20 DIAGNOSIS — S0921XD Traumatic rupture of right ear drum, subsequent encounter: Secondary | ICD-10-CM | POA: Diagnosis not present

## 2019-03-20 DIAGNOSIS — H7291 Unspecified perforation of tympanic membrane, right ear: Secondary | ICD-10-CM | POA: Diagnosis not present

## 2019-03-20 NOTE — Progress Notes (Signed)
OFFICE VISIT  03/20/2019   CC:  Chief Complaint  Patient presents with  . Ear Pain    follow up     HPI:    Patient is a 59 y.o. Caucasian female who presents for 1 wk f/u right ear pain/perf TM. A/P as of last visit: "R otalgia s/p trauma from Q tip use. Suspect traumatic TM rupture with subsequent OM. Will treat with cipro otic 0.85ml in R ear bid x 7d.  Also amoxil 875mg  bid x 7d. Recheck ear in 7d"  Interim hx: No pain. Question of whether it feels any better.  Drops causing some fullness sensation in R ear. Some waxy drainage from R ear.  Hearing improved today. Finished abx. Ear drops last dose last night. No fever, no sinus pain, no neck swelling.  Past Medical History:  Diagnosis Date  . Diabetes mellitus without complication ([place])    controlled- no meds-pt lose weight   . DVT (deep venous thrombosis) ([place]) 2012   coumadin  . GERD (gastroesophageal reflux disease)   . History of adenomatous polyp of colon 2014, 2019   Recall 2024 (Dr. Hilarie Fredrickson).  . Hypercholesterolemia   . Obesity, Class II, BMI 35-39.9     Past Surgical History:  Procedure Laterality Date  . CARPAL TUNNEL RELEASE     bilateral  . COLONOSCOPY  2014; 05/2017   Recall 5 yrs.  Marland Kitchen KNEE ARTHROSCOPY     20 years ago  . POLYPECTOMY    . WISDOM TOOTH EXTRACTION     age 28    Outpatient Medications Prior to Visit  Medication Sig Dispense Refill  . Cholecalciferol (VITAMIN D3 PO) Take by mouth daily.    . Cyanocobalamin (VITAMIN B 12 PO) Take by mouth.    . fish oil-omega-3 fatty acids 1000 MG capsule Take 2 g by mouth daily.    . meloxicam (MOBIC) 15 MG tablet Take 15 mg by mouth daily.    . Multiple Vitamins-Minerals (MULTIVITAMIN PO) Take 1 capsule by mouth daily.    . pravastatin (PRAVACHOL) 20 MG tablet Take 1 tablet (20 mg total) by mouth daily. 90 tablet 2  . ciprofloxacin-fluocinolone PF (OTOVEL) 0.3-0.025 % SOLN Place 0.25 mLs into the right ear 2 (two) times daily. 3.5 mL 0    Facility-Administered Medications Prior to Visit  Medication Dose Route Frequency Provider Last Rate Last Admin  . 0.9 %  sodium chloride infusion  500 mL Intravenous Once Pyrtle, Lajuan Lines, MD        Allergies  Allergen Reactions  . Naproxen Sodium Hives    Hives, itching, vomiting  . Percocet [Oxycodone-Acetaminophen] Nausea And Vomiting    ROS As per HPI  PE: Blood pressure 135/84, pulse 67, temperature 98.4 F (36.9 C), temperature source Temporal, resp. rate 16, height 5\' 1"  (1.549 m), weight 197 lb (89.4 kg), last menstrual period 10/23/2011, SpO2 98 %. Gen: Alert, well appearing.  Patient is oriented to person, place, time, and situation. R EAC w/out erythema or swelling or tenderness. Light yellow debris in canal, indistinct globular debris in distal canal where TM would be, cannot discern whether this is in middle ear and TM is completely ruptured or is this cerumen obscuring view of TM.  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 Indistinct findings in [place] (see exam)-->refer to ENT for further eval. No infection present. No new meds. Cotton ball in ear recommended.  An After Visit Summary was printed and given to the patient.  FOLLOW UP: No follow-ups on file.  Signed:  Crissie Sickles, MD           03/20/2019

## 2019-03-31 DIAGNOSIS — E119 Type 2 diabetes mellitus without complications: Secondary | ICD-10-CM | POA: Insufficient documentation

## 2019-03-31 DIAGNOSIS — R87619 Unspecified abnormal cytological findings in specimens from cervix uteri: Secondary | ICD-10-CM | POA: Insufficient documentation

## 2019-04-01 DIAGNOSIS — H6121 Impacted cerumen, right ear: Secondary | ICD-10-CM | POA: Insufficient documentation

## 2019-04-01 DIAGNOSIS — S00411A Abrasion of right ear, initial encounter: Secondary | ICD-10-CM | POA: Insufficient documentation

## 2019-04-10 ENCOUNTER — Encounter: Payer: Self-pay | Admitting: Family Medicine

## 2019-06-30 ENCOUNTER — Other Ambulatory Visit: Payer: Self-pay | Admitting: Family Medicine

## 2019-06-30 DIAGNOSIS — Z1231 Encounter for screening mammogram for malignant neoplasm of breast: Secondary | ICD-10-CM

## 2019-08-17 ENCOUNTER — Ambulatory Visit: Payer: Managed Care, Other (non HMO)

## 2019-08-18 ENCOUNTER — Ambulatory Visit
Admission: RE | Admit: 2019-08-18 | Discharge: 2019-08-18 | Disposition: A | Discharge status: Home / Self Care | Payer: Managed Care, Other (non HMO) | Source: Ambulatory Visit | Attending: Family Medicine | Admitting: Family Medicine

## 2019-08-18 ENCOUNTER — Other Ambulatory Visit: Payer: Self-pay

## 2019-08-18 DIAGNOSIS — Z1231 Encounter for screening mammogram for malignant neoplasm of breast: Secondary | ICD-10-CM

## 2019-10-29 ENCOUNTER — Other Ambulatory Visit: Payer: Self-pay

## 2019-11-02 ENCOUNTER — Other Ambulatory Visit: Payer: Self-pay

## 2019-11-02 ENCOUNTER — Encounter: Payer: Self-pay | Admitting: Family Medicine

## 2019-11-02 ENCOUNTER — Ambulatory Visit (INDEPENDENT_AMBULATORY_CARE_PROVIDER_SITE_OTHER): Payer: Managed Care, Other (non HMO) | Admitting: Family Medicine

## 2019-11-02 VITALS — BP 141/82 | HR 69 | Temp 97.8°F | Ht 61.02 in | Wt 171.0 lb

## 2019-11-02 DIAGNOSIS — Z0000 Encounter for general adult medical examination without abnormal findings: Secondary | ICD-10-CM

## 2019-11-02 DIAGNOSIS — E7800 Pure hypercholesterolemia, unspecified: Secondary | ICD-10-CM | POA: Diagnosis not present

## 2019-11-02 DIAGNOSIS — E119 Type 2 diabetes mellitus without complications: Secondary | ICD-10-CM | POA: Diagnosis not present

## 2019-11-02 NOTE — Progress Notes (Signed)
Office Note 11/02/2019  CC:  Chief Complaint  Patient presents with   Annual Exam    HPI:  Gail Vaughn is a 59 y.o. White female who is here for annual health maintenance exam as well as f/u DM 2 and HLD. Dr. Gaetano Net is her GYN MD. Feeling very well.  Still working for city of [employer], has lived in [place] for about a year now. Dieting very well, intermittent fasting, lost over 30 lbs doing this! Occ bike riding for exercise.  Highest glucose in last several months has been 120. Usually 80s-low 90s fasting AND non-fasting.  Home bp monitoring: consistently <130/80.  Past Medical History:  Diagnosis Date   Abrasion of right ear canal 2021   with cerumen impaction; ENT ended up seeing her and cleared everything out and confirmed NO TM RUPTURE.   Diabetes mellitus without complication ([place])    controlled- no meds-pt lose weight    DVT (deep venous thrombosis) ([place]) 2012   coumadin   GERD (gastroesophageal reflux disease)    History of adenomatous polyp of colon 2014, 2019   Recall 2024 (Dr. Hilarie Fredrickson).   Hypercholesterolemia    Obesity, Class II, BMI 35-39.9     Past Surgical History:  Procedure Laterality Date   CARPAL TUNNEL RELEASE     bilateral   COLONOSCOPY  2014; 05/2017   Recall 5 yrs.   KNEE ARTHROSCOPY     20 years ago   POLYPECTOMY     WISDOM TOOTH EXTRACTION     age 64    Family History  Problem Relation Age of Onset   Lung cancer Mother    Breast cancer Cousin    Esophageal cancer Cousin    Early death Father        motorcycle accident   Multiple sclerosis Sister    Colon cancer Neg Hx    Stomach cancer Neg Hx     Social History   Socioeconomic History   Marital status: Married    Spouse name: Not on file   Number of children: Not on file   Years of education: Not on file   Highest education level: Not on file  Occupational History   Not on file  Tobacco Use   Smoking status: Former Smoker   Smokeless  tobacco: Never Used  Scientific laboratory technician Use: Never used  Substance and Sexual Activity   Alcohol use: Yes    Comment: occasionally once every 2-3 months per pt   Drug use: No   Sexual activity: Not on file  Other Topics Concern   Not on file  Social History Narrative   Married [place]), one son Jenny Reichmann).   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   No T/A/Ds.   Social Determinants of Health   Financial Resource Strain:    Difficulty of Paying Living Expenses: Not on file  Food Insecurity:    Worried About Charity fundraiser in the Last Year: Not on file   [employer] of Food in the Last Year: Not on file  Transportation Needs:    Lack of Transportation (Medical): Not on file   Lack of Transportation (Non-Medical): Not on file  Physical Activity:    Days of Exercise per Week: Not on file   Minutes of Exercise per Session: Not on file  Stress:    Feeling of Stress : Not on file  Social Connections:    Frequency of Communication with Friends and Family: Not on file  Frequency of Social Gatherings with Friends and Family: Not on file   Attends Religious Services: Not on file   Active Member of Clubs or Organizations: Not on file   Attends Archivist Meetings: Not on file   Marital Status: Not on file  Intimate Partner Violence:    Fear of Current or Ex-Partner: Not on file   Emotionally Abused: Not on file   Physically Abused: Not on file   Sexually Abused: Not on file    Outpatient Medications Prior to Visit  Medication Sig Dispense Refill   Cholecalciferol (VITAMIN D3 PO) Take by mouth daily.     fish oil-omega-3 fatty acids 1000 MG capsule Take 2 g by mouth daily.     meloxicam (MOBIC) 15 MG tablet Take 15 mg by mouth daily.     Multiple Vitamins-Minerals (MULTIVITAMIN PO) Take 1 capsule by mouth daily.     pravastatin (PRAVACHOL) 20 MG tablet Take 1 tablet (20 mg total) by mouth daily. 90 tablet 2   Facility-Administered  Medications Prior to Visit  Medication Dose Route Frequency Provider Last Rate Last Admin   0.9 %  sodium chloride infusion  500 mL Intravenous Once Pyrtle, Lajuan Lines, MD        Allergies  Allergen Reactions   Naproxen Sodium Hives    Hives, itching, vomiting   Oxycodone Nausea And Vomiting   Percocet [Oxycodone-Acetaminophen] Nausea And Vomiting    ROS Review of Systems  Constitutional: Negative for appetite change, chills, fatigue and fever.  HENT: Negative for congestion, dental problem, ear pain and sore throat.   Eyes: Negative for discharge, redness and visual disturbance.  Respiratory: Negative for cough, chest tightness, shortness of breath and wheezing.   Cardiovascular: Negative for chest pain, palpitations and leg swelling.  Gastrointestinal: Negative for abdominal pain, blood in stool, diarrhea, nausea and vomiting.  Genitourinary: Negative for difficulty urinating, dysuria, flank pain, frequency, hematuria and urgency.  Musculoskeletal: Negative for arthralgias, back pain, joint swelling, myalgias and neck stiffness.  Skin: Negative for pallor and rash.  Neurological: Negative for dizziness, speech difficulty, weakness and headaches.  Hematological: Negative for adenopathy. Does not bruise/bleed easily.  Psychiatric/Behavioral: Negative for confusion and sleep disturbance. The patient is not nervous/anxious.     PE; Vitals with BMI 11/02/2019 03/20/2019 03/11/2019  Height 5' 1.024" 5\' 1"  5\' 1"   Weight 171 lbs 197 lbs 196 lbs 6 oz  BMI 32.29 73.53 29.92  Systolic 426 834 196  Diastolic 82 84 88  Pulse 69 67 75   Exam chaperoned by Deveron Furlong, CMA.  Gen: Alert, well appearing.  Patient is oriented to person, place, time, and situation. AFFECT: pleasant, lucid thought and speech. ENT: Ears: EACs clear, normal epithelium.  TMs with good light reflex and landmarks bilaterally.  Eyes: no injection, icteris, swelling, or exudate.  EOMI, PERRLA. Nose: no drainage  or turbinate edema/swelling.  No injection or focal lesion.  Mouth: lips without lesion/swelling.  Oral mucosa pink and moist.  Dentition intact and without obvious caries or gingival swelling.  Oropharynx without erythema, exudate, or swelling.  Neck: supple/nontender.  No LAD, mass, or TM.  Carotid pulses 2+ bilaterally, without bruits. CV: RRR, no m/r/g.   LUNGS: CTA bilat, nonlabored resps, good aeration in all lung fields. ABD: soft, NT, ND, BS normal.  No hepatospenomegaly or mass.  No bruits. EXT: no clubbing, cyanosis, or edema.  Musculoskeletal: no joint swelling, erythema, warmth, or tenderness.  ROM of all joints intact. Skin - no sores  or suspicious lesions or rashes or color changes   Pertinent labs:  Lab Results  Component Value Date   TSH 1.62 11/21/2018   Lab Results  Component Value Date   WBC 6.2 11/21/2018   HGB 13.9 11/21/2018   HCT 42.1 11/21/2018   MCV 88.6 11/21/2018   PLT 280.0 11/21/2018   Lab Results  Component Value Date   CREATININE 0.71 11/21/2018   BUN 20 11/21/2018   NA 141 11/21/2018   K 4.5 11/21/2018   CL 105 11/21/2018   CO2 28 11/21/2018   Lab Results  Component Value Date   ALT 20 11/21/2018   AST 15 11/21/2018   ALKPHOS 54 11/21/2018   BILITOT 0.8 11/21/2018   Lab Results  Component Value Date   CHOL 186 11/21/2018   Lab Results  Component Value Date   HDL 57.20 11/21/2018   Lab Results  Component Value Date   LDLCALC 107 (H) 11/21/2018   Lab Results  Component Value Date   TRIG 109.0 11/21/2018   Lab Results  Component Value Date   CHOLHDL 3 11/21/2018   Lab Results  Component Value Date   HGBA1C 7.2 (H) 11/21/2018    ASSESSMENT AND PLAN:   1) DM: diet controlled, excellent home glucoses. A1c, urine microalb/cr today, Lytes/cr today. Feet exam normal today. Had eye exam recently and we'll get this report.  2) HLD: has been weaning slowly off her pravastatin b/c of great wt loss and desire not to be on this  type of med (had minimal fatigue and myalgias when on it daily but was tolerating it).  3) Elevated bp w/out dx HTN: home measurements are consistently normal. Repeat manual bp at end of visit 130/78.  4) ) Health maintenance exam: Reviewed age and gender appropriate health maintenance issues (prudent diet, regular exercise, health risks of tobacco and excessive alcohol, use of seatbelts, fire alarms in home, use of sunscreen).  Also reviewed age and gender appropriate health screening as well as vaccine recommendations. Vaccines: she declines Tdap, flu, and covid vaccines. Labs: fasting HP + a1c + urine microalb/cr today. Cervical ca screening: last pap was this year (?pap, or only pelvic w/out pap?---pt reports normal.   Most recent pap we have in records is 2020.  She goes back to her GYN MD annually. Breast ca screening: mammogram UTD 07/2019---repeat 07/2020. Colon ca screening: recall 05/2022.  An After Visit Summary was printed and given to the patient.  FOLLOW UP:  Return in about 1 year (around 11/01/2020) for annual CPE (fasting).  Signed:  Crissie Sickles, MD           11/02/2019

## 2019-11-02 NOTE — Patient Instructions (Signed)
Health Maintenance, Female Adopting a healthy lifestyle and getting preventive care are important in promoting health and wellness. Ask your health care provider about:  The right schedule for you to have regular tests and exams.  Things you can do on your own to prevent diseases and keep yourself healthy. What should I know about diet, weight, and exercise? Eat a healthy diet   Eat a diet that includes plenty of vegetables, fruits, low-fat dairy products, and lean protein.  Do not eat a lot of foods that are high in solid fats, added sugars, or sodium. Maintain a healthy weight Body mass index (BMI) is used to identify weight problems. It estimates body fat based on height and weight. Your health care provider can help determine your BMI and help you achieve or maintain a healthy weight. Get regular exercise Get regular exercise. This is one of the most important things you can do for your health. Most adults should:  Exercise for at least 150 minutes each week. The exercise should increase your heart rate and make you sweat (moderate-intensity exercise).  Do strengthening exercises at least twice a week. This is in addition to the moderate-intensity exercise.  Spend less time sitting. Even light physical activity can be beneficial. Watch cholesterol and blood lipids Have your blood tested for lipids and cholesterol at 58 years of age, then have this test every 5 years. Have your cholesterol levels checked more often if:  Your lipid or cholesterol levels are high.  You are older than 58 years of age.  You are at high risk for heart disease. What should I know about cancer screening? Depending on your health history and family history, you may need to have cancer screening at various ages. This may include screening for:  Breast cancer.  Cervical cancer.  Colorectal cancer.  Skin cancer.  Lung cancer. What should I know about heart disease, diabetes, and high blood  pressure? Blood pressure and heart disease  High blood pressure causes heart disease and increases the risk of stroke. This is more likely to develop in people who have high blood pressure readings, are of African descent, or are overweight.  Have your blood pressure checked: ? Every 3-5 years if you are 18-39 years of age. ? Every year if you are 40 years old or older. Diabetes Have regular diabetes screenings. This checks your fasting blood sugar level. Have the screening done:  Once every three years after age 40 if you are at a normal weight and have a low risk for diabetes.  More often and at a younger age if you are overweight or have a high risk for diabetes. What should I know about preventing infection? Hepatitis B If you have a higher risk for hepatitis B, you should be screened for this virus. Talk with your health care provider to find out if you are at risk for hepatitis B infection. Hepatitis C Testing is recommended for:  Everyone born from 1945 through 1965.  Anyone with known risk factors for hepatitis C. Sexually transmitted infections (STIs)  Get screened for STIs, including gonorrhea and chlamydia, if: ? You are sexually active and are younger than 58 years of age. ? You are older than 58 years of age and your health care provider tells you that you are at risk for this type of infection. ? Your sexual activity has changed since you were last screened, and you are at increased risk for chlamydia or gonorrhea. Ask your health care provider if   you are at risk.  Ask your health care provider about whether you are at high risk for HIV. Your health care provider may recommend a prescription medicine to help prevent HIV infection. If you choose to take medicine to prevent HIV, you should first get tested for HIV. You should then be tested every 3 months for as long as you are taking the medicine. Pregnancy  If you are about to stop having your period (premenopausal) and  you may become pregnant, seek counseling before you get pregnant.  Take 400 to 800 micrograms (mcg) of folic acid every day if you become pregnant.  Ask for birth control (contraception) if you want to prevent pregnancy. Osteoporosis and menopause Osteoporosis is a disease in which the bones lose minerals and strength with aging. This can result in bone fractures. If you are 65 years old or older, or if you are at risk for osteoporosis and fractures, ask your health care provider if you should:  Be screened for bone loss.  Take a calcium or vitamin D supplement to lower your risk of fractures.  Be given hormone replacement therapy (HRT) to treat symptoms of menopause. Follow these instructions at home: Lifestyle  Do not use any products that contain nicotine or tobacco, such as cigarettes, e-cigarettes, and chewing tobacco. If you need help quitting, ask your health care provider.  Do not use street drugs.  Do not share needles.  Ask your health care provider for help if you need support or information about quitting drugs. Alcohol use  Do not drink alcohol if: ? Your health care provider tells you not to drink. ? You are pregnant, may be pregnant, or are planning to become pregnant.  If you drink alcohol: ? Limit how much you use to 0-1 drink a day. ? Limit intake if you are breastfeeding.  Be aware of how much alcohol is in your drink. In the [place] one drink equals one 12 oz bottle of beer (355 mL), one 5 oz glass of wine (148 mL), or one 1 oz glass of hard liquor (44 mL). General instructions  Schedule regular health, dental, and eye exams.  Stay current with your vaccines.  Tell your health care provider if: ? You often feel depressed. ? You have ever been abused or do not feel safe at home. Summary  Adopting a healthy lifestyle and getting preventive care are important in promoting health and wellness.  Follow your health care provider's instructions about healthy  diet, exercising, and getting tested or screened for diseases.  Follow your health care provider's instructions on monitoring your cholesterol and blood pressure. This information is not intended to replace advice given to you by your health care provider. Make sure you discuss any questions you have with your health care provider. Document Revised: 01/01/2018 Document Reviewed: 01/01/2018 Elsevier Patient Education  2020 [employer] Inc[employer]  

## 2019-11-03 LAB — COMPREHENSIVE METABOLIC PANEL
ALT: 18 U/L (ref 0–35)
AST: 16 U/L (ref 0–37)
Albumin: 4.9 g/dL (ref 3.5–5.2)
Alkaline Phosphatase: 46 U/L (ref 39–117)
BUN: 13 mg/dL (ref 6–23)
CO2: 28 mEq/L (ref 19–32)
Calcium: 10.3 mg/dL (ref 8.4–10.5)
Chloride: 101 mEq/L (ref 96–112)
Creatinine, Ser: 0.71 mg/dL (ref 0.40–1.20)
GFR: 93.94 mL/min (ref >60.00)
Glucose, Bld: 79 mg/dL (ref 70–99)
Potassium: 4 mEq/L (ref 3.5–5.1)
Sodium: 140 mEq/L (ref 135–145)
Total Bilirubin: 0.9 mg/dL (ref 0.2–1.2)
Total Protein: 7.4 g/dL (ref 6.0–8.3)

## 2019-11-03 LAB — CBC WITH DIFFERENTIAL/PLATELET
Basophils Absolute: 0.1 10*3/uL (ref 0.0–0.1)
Basophils Relative: 1.1 % (ref 0.0–3.0)
Eosinophils Absolute: 0.2 10*3/uL (ref 0.0–0.7)
Eosinophils Relative: 2.4 % (ref 0.0–5.0)
HCT: 44.8 % (ref 36.0–46.0)
Hemoglobin: 14.8 g/dL (ref 12.0–15.0)
Lymphocytes Relative: 40.9 % (ref 12.0–46.0)
Lymphs Abs: 3.1 10*3/uL (ref 0.7–4.0)
MCHC: 33 g/dL (ref 30.0–36.0)
MCV: 89.1 fl (ref 78.0–100.0)
Monocytes Absolute: 0.3 10*3/uL (ref 0.1–1.0)
Monocytes Relative: 3.4 % (ref 3.0–12.0)
Neutro Abs: 4 10*3/uL (ref 1.4–7.7)
Neutrophils Relative %: 52.2 % (ref 43.0–77.0)
Platelets: 269 10*3/uL (ref 150.0–400.0)
RBC: 5.02 Mil/uL (ref 3.87–5.11)
RDW: 14.4 % (ref 11.5–15.5)
WBC: 7.5 10*3/uL (ref 4.0–10.5)

## 2019-11-03 LAB — MICROALBUMIN / CREATININE URINE RATIO
Creatinine,U: 30.9 mg/dL
Microalb Creat Ratio: 2.3 mg/g (ref 0.0–30.0)
Microalb, Ur: <0.7 mg/dL (ref 0.0–1.9)

## 2019-11-03 LAB — LIPID PANEL
Cholesterol: 195 mg/dL (ref 0–200)
HDL: 63.6 mg/dL (ref >39.00)
LDL Cholesterol: 114 mg/dL — ABNORMAL HIGH (ref 0–99)
NonHDL: 131.54
Total CHOL/HDL Ratio: 3
Triglycerides: 90 mg/dL (ref 0.0–149.0)
VLDL: 18 mg/dL (ref 0.0–40.0)

## 2019-11-03 LAB — HEMOGLOBIN A1C: Hgb A1c MFr Bld: 6.3 % (ref 4.6–6.5)

## 2019-11-03 LAB — TSH: TSH: 2.01 u[IU]/mL (ref 0.35–4.50)

## 2019-11-04 ENCOUNTER — Encounter: Payer: Self-pay | Admitting: Family Medicine

## 2019-12-08 ENCOUNTER — Telehealth: Payer: Self-pay | Admitting: Family Medicine

## 2019-12-08 NOTE — Telephone Encounter (Deleted)
Patient received states insurance left her $35 copay for 11/02/19 Physical appt. Insurance told her it was coded as "partial diagnostic" and not as a physical. Please review claim and advise patient.

## 2019-12-14 NOTE — Telephone Encounter (Signed)
I emailed [employer] asking them to look into this billing concern for the patient.

## 2019-12-14 NOTE — Telephone Encounter (Signed)
Patient had an appointment for a physical on 11/02/19. She said she received a bill from [facility] for $35. When she called the insurance, they told her that appt was coded as a "partial diagnostic" appointment instead of a physical. Due to that incorrect coding, the insurance determined she should have paid $35 copay. She would like the coding corrected to reflect the physical appointment.

## 2020-07-18 ENCOUNTER — Encounter: Payer: Self-pay | Admitting: Family Medicine

## 2020-07-29 ENCOUNTER — Other Ambulatory Visit: Payer: Self-pay | Admitting: Family Medicine

## 2020-07-29 DIAGNOSIS — Z1231 Encounter for screening mammogram for malignant neoplasm of breast: Secondary | ICD-10-CM

## 2020-09-21 ENCOUNTER — Other Ambulatory Visit: Payer: Self-pay

## 2020-09-21 ENCOUNTER — Ambulatory Visit
Admission: RE | Admit: 2020-09-21 | Discharge: 2020-09-21 | Disposition: A | Discharge status: Home / Self Care | Payer: Managed Care, Other (non HMO) | Source: Ambulatory Visit | Attending: Family Medicine | Admitting: Family Medicine

## 2020-09-21 DIAGNOSIS — Z1231 Encounter for screening mammogram for malignant neoplasm of breast: Secondary | ICD-10-CM

## 2020-11-04 IMAGING — MG DIGITAL SCREENING BILATERAL MAMMOGRAM WITH TOMO AND CAD
8 series · 8 of 24 positions shown · non-contrast
Comparison: Previous exam(s).

CLINICAL DATA: Screening.

EXAM:
DIGITAL SCREENING BILATERAL MAMMOGRAM WITH TOMO AND CAD

[L CC synth-2D]
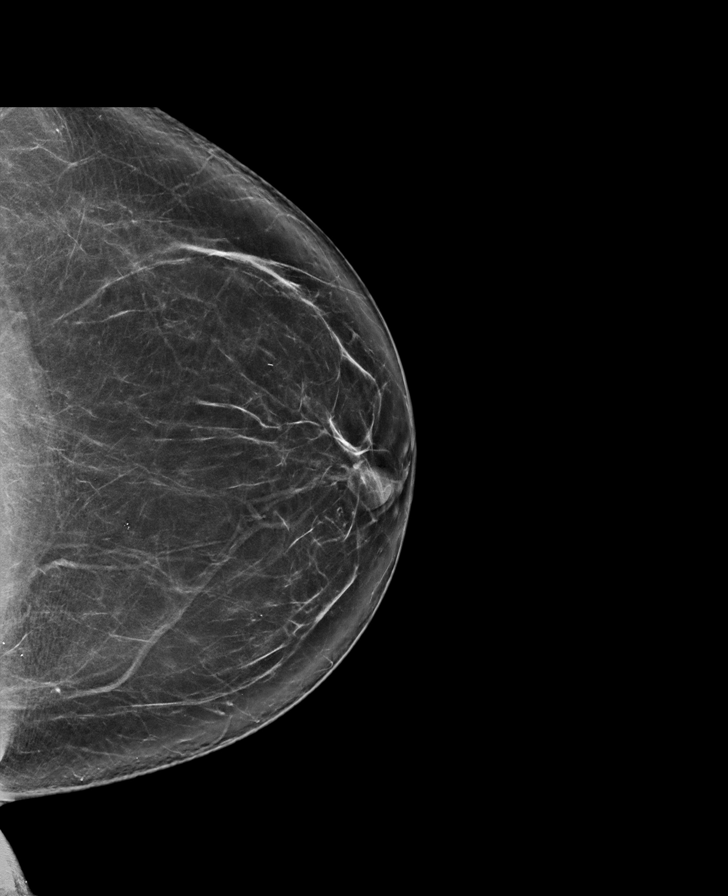

[R CC synth-2D]
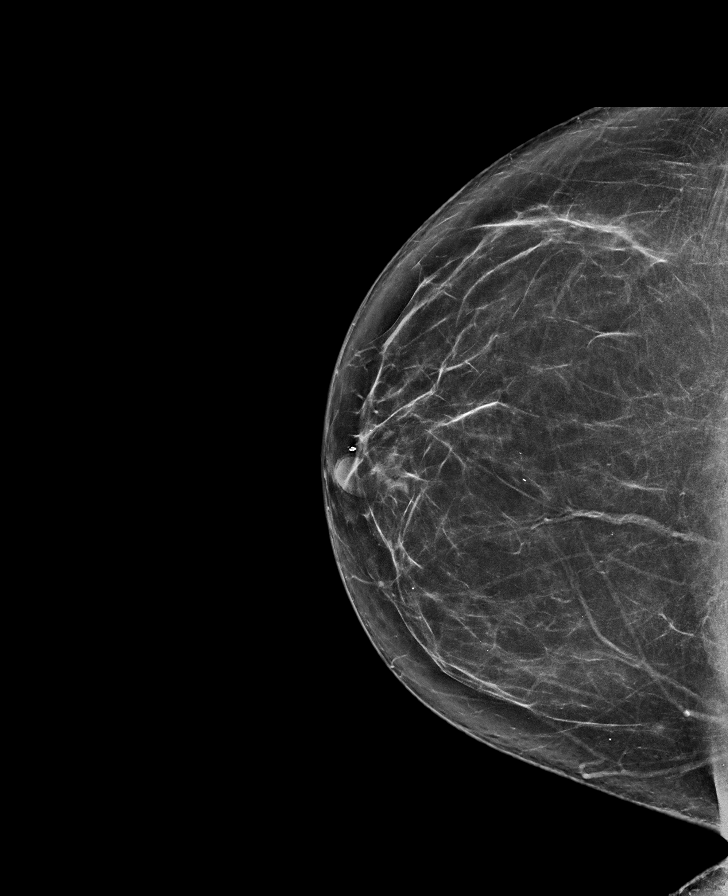

[R MLO synth-2D]
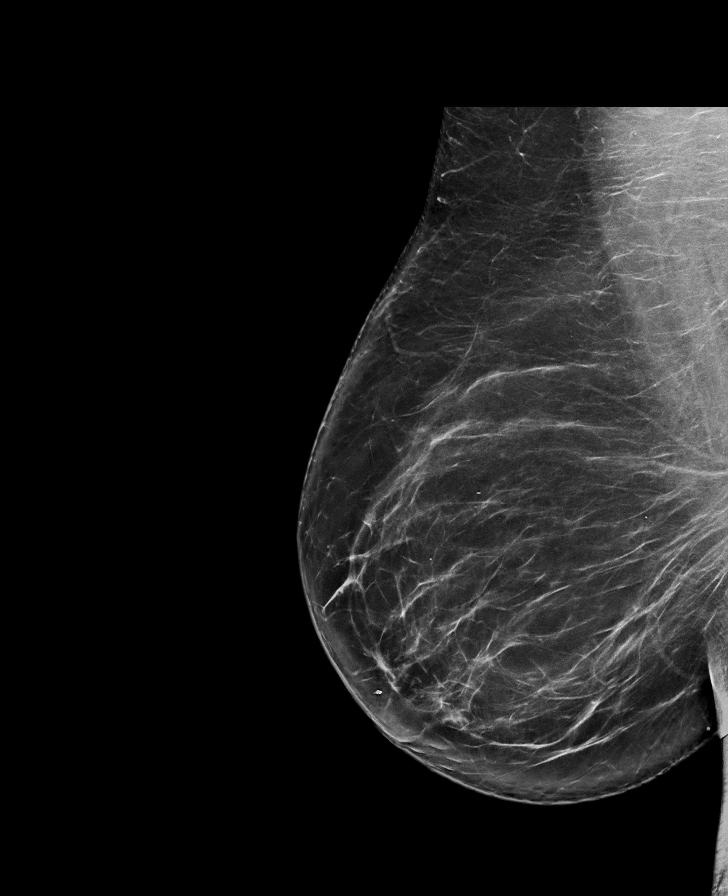

[L MLO synth-2D]
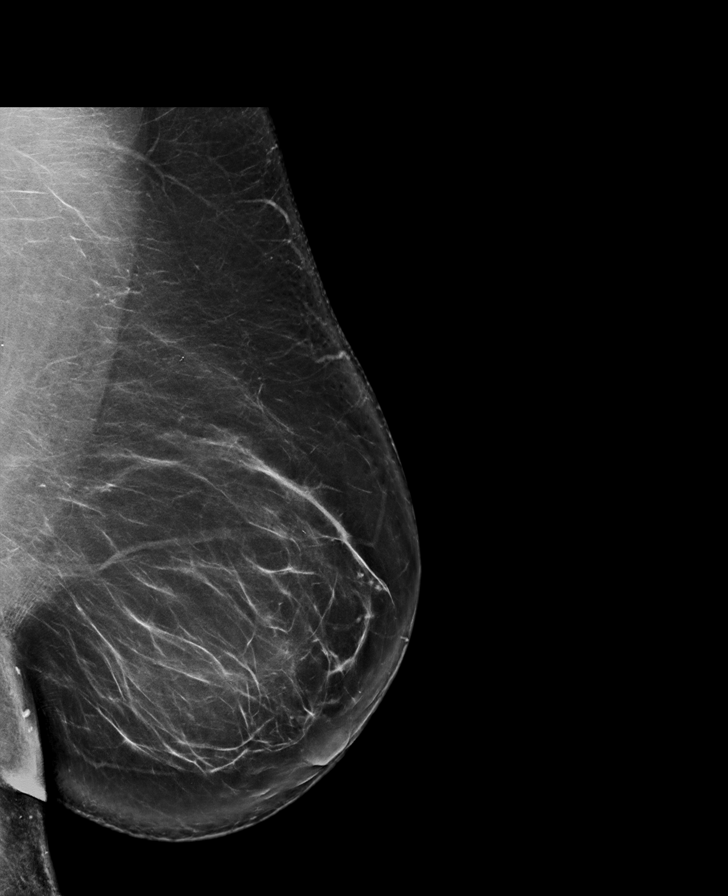

[L CC tomo · tomo slice 43/85.0]
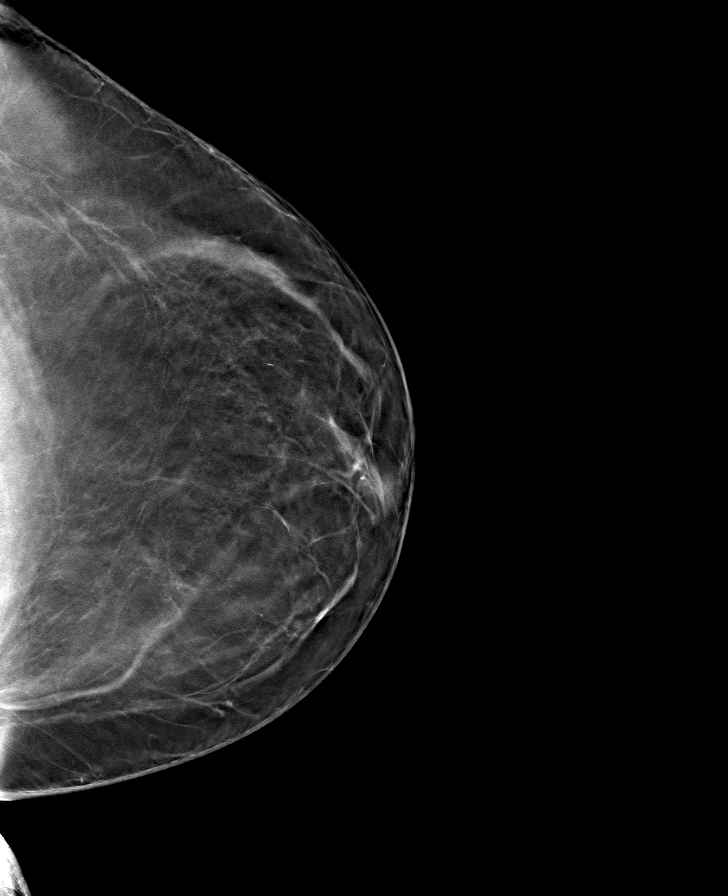

[R MLO tomo · tomo slice 47/94.0]
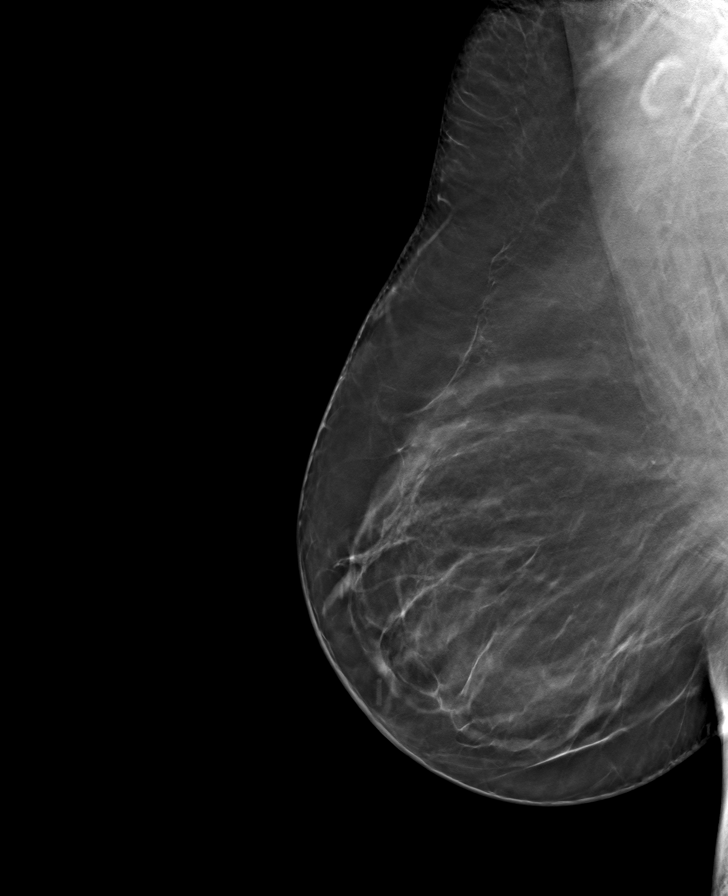

[L MLO tomo · tomo slice 51/100.0]
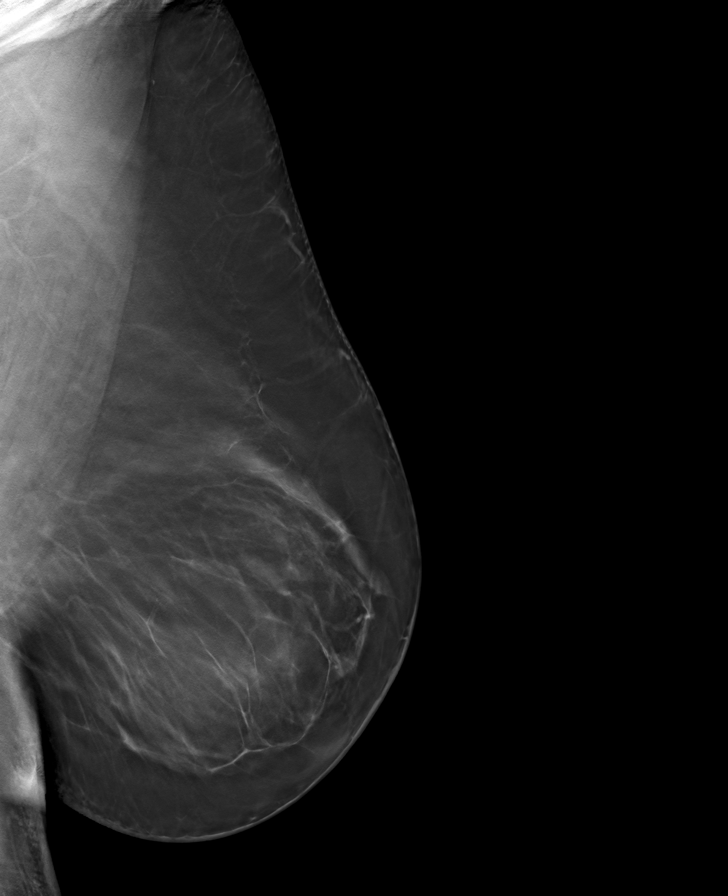

[R CC tomo · tomo slice 40/79.0]
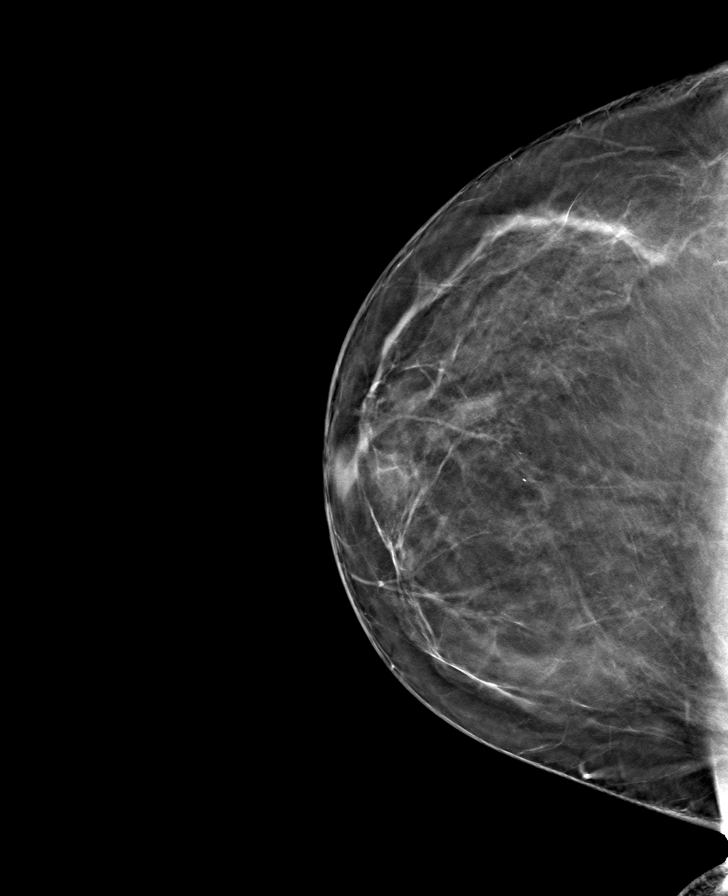

[8 of 24 positions shown; findings below may reference images not displayed]

ACR Breast Density Category b: There are scattered areas of
fibroglandular density.
FINDINGS: There are no findings suspicious for malignancy. Images were
processed with CAD.
IMPRESSION: No mammographic evidence of malignancy. A result letter of this
screening mammogram will be mailed directly to the patient.

RECOMMENDATION:
Screening mammogram in one year. (Code:[id number])

BI-RADS CATEGORY  1: Negative.

## 2020-11-23 ENCOUNTER — Other Ambulatory Visit: Payer: Self-pay

## 2020-11-24 ENCOUNTER — Encounter: Payer: Self-pay | Admitting: Family Medicine

## 2020-11-24 ENCOUNTER — Ambulatory Visit (INDEPENDENT_AMBULATORY_CARE_PROVIDER_SITE_OTHER): Payer: Managed Care, Other (non HMO) | Admitting: Family Medicine

## 2020-11-24 ENCOUNTER — Other Ambulatory Visit: Payer: Self-pay

## 2020-11-24 VITALS — BP 137/84 | HR 58 | Temp 97.8°F | Ht 60.0 in | Wt 174.2 lb

## 2020-11-24 DIAGNOSIS — Z0000 Encounter for general adult medical examination without abnormal findings: Secondary | ICD-10-CM

## 2020-11-24 DIAGNOSIS — E7800 Pure hypercholesterolemia, unspecified: Secondary | ICD-10-CM | POA: Diagnosis not present

## 2020-11-24 DIAGNOSIS — R030 Elevated blood-pressure reading, without diagnosis of hypertension: Secondary | ICD-10-CM

## 2020-11-24 DIAGNOSIS — E119 Type 2 diabetes mellitus without complications: Secondary | ICD-10-CM

## 2020-11-24 NOTE — Progress Notes (Signed)
OFFICE VISIT  11/24/2020  CC:  Chief Complaint  Patient presents with   Annual Exam    Pt is fasting. Goes to [facility] in [place], [place] for eye exam. Declines all vaccines today and screening for HIV and Hep C.     HPI:    Patient is a 60 y.o. female who presents for annual physical exam. She reports she is doing well and is actively exercising and balancing her diet to control her DM. She had a diagnosis of Asthma in the past, but she does not require medication nor does she ever experience symptoms. She does not have any complaints today. ROS negative (cough, congestion, abdominal pain, headaches). Patient attributed her elevated blood pressure to recent stress from her job. She is eager to take her next trip with her husband.  Past Medical History:  Diagnosis Date   Abrasion of right ear canal 2021   with cerumen impaction; ENT ended up seeing her and cleared everything out and confirmed NO TM RUPTURE.   Diabetes mellitus without complication ([place])    controlled- no meds-pt lose weight    DVT (deep venous thrombosis) ([place]) 2012   coumadin   GERD (gastroesophageal reflux disease)    History of adenomatous polyp of colon 2014, 2019   Recall 2024 (Dr. Hilarie Fredrickson).   Hypercholesterolemia    mild, pt was on statin for a while but preferred to get off it--she does not want to take meds for this (2021)   Obesity, Class II, BMI 35-39.9     Past Surgical History:  Procedure Laterality Date   CARPAL TUNNEL RELEASE     bilateral   COLONOSCOPY  2014; 05/2017   Recall 5 yrs.   KNEE ARTHROSCOPY     20 years ago   POLYPECTOMY     WISDOM TOOTH EXTRACTION     age 74    Outpatient Medications Prior to Visit  Medication Sig Dispense Refill   Cholecalciferol (VITAMIN D3 PO) Take by mouth daily.     fish oil-omega-3 fatty acids 1000 MG capsule Take 2 g by mouth daily.     OVER THE COUNTER MEDICATION daily. Colloidal minerals- liquid     Multiple Vitamins-Minerals  (MULTIVITAMIN PO) Take 1 capsule by mouth daily. (Patient not taking: Reported on 11/24/2020)     Facility-Administered Medications Prior to Visit  Medication Dose Route Frequency Provider Last Rate Last Admin   0.9 %  sodium chloride infusion  500 mL Intravenous Once Pyrtle, Lajuan Lines, MD        Allergies  Allergen Reactions   Naproxen Sodium Hives    Hives, itching, vomiting   Oxycodone Nausea And Vomiting    ROS As per HPI  PE: Vitals with BMI 11/24/2020 11/02/2019 03/20/2019  Height 5\' 0"  5' 1.024" 5\' 1"   Weight 174 lbs 3 oz 171 lbs 197 lbs  BMI 34.02 62.83 15.17  Systolic 616 073 710  Diastolic 84 82 84  Pulse 58 69 67   Physical Exam Constitutional:      Appearance: Normal appearance.  Cardiovascular:     Rate and Rhythm: Normal rate and regular rhythm.     Heart sounds: Normal heart sounds.  Pulmonary:     Effort: Pulmonary effort is normal.     Breath sounds: Normal breath sounds.  Abdominal:     General: Abdomen is flat.     Palpations: Abdomen is soft.  Neurological:     Mental Status: She is alert.  Psychiatric:  Mood and Affect: Mood normal.        Behavior: Behavior normal.   Diabetic Foot exam Appearance is normal with intact skin, no punctures or wounds appreciated Sensation intact bilateral across entire dorsum and plantar surface of the foot. Sensation  to diabetic foot probe intact Motor function intact Posterior tibialis pulses present bilaterally   LABS:  Lab Orders         CBC with Differential/Platelet         Comprehensive metabolic panel         Lipid panel         TSH         Hemoglobin A1c         Microalbumin / creatinine urine ratio      IMPRESSION AND PLAN:  DM -- Well controlled with exercise and diet. Last year 6.3 HbA1c will be followed up with new HbA1c and albumin/creatinine urine measurement. Will reassess if labs values are abnormal and consider addition of metformin or lisinopril. Continue to encourage healthy diet  and exercise. HTN- Elevated during today's visit (137/80) but well controlled per home measurements (averaging around 120s/80s). No medication recommended at this time. Asthma- well controlled. Patient has not had an asthmatic episode or experienced asthma like symptoms (dyspnea, SOB, tachypnea, chest discomfort/tightness) as far as she can remember. Declined emergency inhaler.  Health maintenance exam: Reviewed age and gender appropriate health maintenance issues (prudent diet, regular exercise, health risks of tobacco and excessive alcohol, use of seatbelts, fire alarms in home, use of sunscreen).  Also reviewed age and gender appropriate health screening as well as vaccine recommendations. Vaccines: shingrix declined.  Flu declined.  Prevnar 20 declined. Cervical ca screening: 03/2019--she is utd with GYN annual exams. Breast ca screening: normal 08/2020. Colon ca screening:  05/2017->recall 2024.  An After Visit Summary was printed and given to the patient.  FOLLOW UP: Return in about 1 year (around 11/24/2021) for annual CPE (fasting).  Phil Dopp -MS3  I personally was present during the history, physical exam, and medical decision-making activities of this service and have verified that the service and findings are accurately documented in the student's note. Signed:  Crissie Sickles, MD           11/24/2020

## 2020-11-24 NOTE — Patient Instructions (Signed)
Health Maintenance, Female Adopting a healthy lifestyle and getting preventive care are important in promoting health and wellness. Ask your health care provider about: The right schedule for you to have regular tests and exams. Things you can do on your own to prevent diseases and keep yourself healthy. What should I know about diet, weight, and exercise? Eat a healthy diet  Eat a diet that includes plenty of vegetables, fruits, low-fat dairy products, and lean protein. Do not eat a lot of foods that are high in solid fats, added sugars, or sodium. Maintain a healthy weight Body mass index (BMI) is used to identify weight problems. It estimates body fat based on height and weight. Your health care provider can help determine your BMI and help you achieve or maintain a healthy weight. Get regular exercise Get regular exercise. This is one of the most important things you can do for your health. Most adults should: Exercise for at least 150 minutes each week. The exercise should increase your heart rate and make you sweat (moderate-intensity exercise). Do strengthening exercises at least twice a week. This is in addition to the moderate-intensity exercise. Spend less time sitting. Even light physical activity can be beneficial. Watch cholesterol and blood lipids Have your blood tested for lipids and cholesterol at 59 years of age, then have this test every 5 years. Have your cholesterol levels checked more often if: Your lipid or cholesterol levels are high. You are older than 59 years of age. You are at high risk for heart disease. What should I know about cancer screening? Depending on your health history and family history, you may need to have cancer screening at various ages. This may include screening for: Breast cancer. Cervical cancer. Colorectal cancer. Skin cancer. Lung cancer. What should I know about heart disease, diabetes, and high blood pressure? Blood pressure and heart  disease High blood pressure causes heart disease and increases the risk of stroke. This is more likely to develop in people who have high blood pressure readings, are of African descent, or are overweight. Have your blood pressure checked: Every 3-5 years if you are 18-39 years of age. Every year if you are 40 years old or older. Diabetes Have regular diabetes screenings. This checks your fasting blood sugar level. Have the screening done: Once every three years after age 40 if you are at a normal weight and have a low risk for diabetes. More often and at a younger age if you are overweight or have a high risk for diabetes. What should I know about preventing infection? Hepatitis B If you have a higher risk for hepatitis B, you should be screened for this virus. Talk with your health care provider to find out if you are at risk for hepatitis B infection. Hepatitis C Testing is recommended for: Everyone born from 1945 through 1965. Anyone with known risk factors for hepatitis C. Sexually transmitted infections (STIs) Get screened for STIs, including gonorrhea and chlamydia, if: You are sexually active and are younger than 59 years of age. You are older than 59 years of age and your health care provider tells you that you are at risk for this type of infection. Your sexual activity has changed since you were last screened, and you are at increased risk for chlamydia or gonorrhea. Ask your health care provider if you are at risk. Ask your health care provider about whether you are at high risk for HIV. Your health care provider may recommend a prescription medicine   to help prevent HIV infection. If you choose to take medicine to prevent HIV, you should first get tested for HIV. You should then be tested every 3 months for as long as you are taking the medicine. Pregnancy If you are about to stop having your period (premenopausal) and you may become pregnant, seek counseling before you get  pregnant. Take 400 to 800 micrograms (mcg) of folic acid every day if you become pregnant. Ask for birth control (contraception) if you want to prevent pregnancy. Osteoporosis and menopause Osteoporosis is a disease in which the bones lose minerals and strength with aging. This can result in bone fractures. If you are 65 years old or older, or if you are at risk for osteoporosis and fractures, ask your health care provider if you should: Be screened for bone loss. Take a calcium or vitamin D supplement to lower your risk of fractures. Be given hormone replacement therapy (HRT) to treat symptoms of menopause. Follow these instructions at home: Lifestyle Do not use any products that contain nicotine or tobacco, such as cigarettes, e-cigarettes, and chewing tobacco. If you need help quitting, ask your health care provider. Do not use street drugs. Do not share needles. Ask your health care provider for help if you need support or information about quitting drugs. Alcohol use Do not drink alcohol if: Your health care provider tells you not to drink. You are pregnant, may be pregnant, or are planning to become pregnant. If you drink alcohol: Limit how much you use to 0-1 drink a day. Limit intake if you are breastfeeding. Be aware of how much alcohol is in your drink. In the [place] one drink equals one 12 oz bottle of beer (355 mL), one 5 oz glass of wine (148 mL), or one 1 oz glass of hard liquor (44 mL). General instructions Schedule regular health, dental, and eye exams. Stay current with your vaccines. Tell your health care provider if: You often feel depressed. You have ever been abused or do not feel safe at home. Summary Adopting a healthy lifestyle and getting preventive care are important in promoting health and wellness. Follow your health care provider's instructions about healthy diet, exercising, and getting tested or screened for diseases. Follow your health care provider's  instructions on monitoring your cholesterol and blood pressure. This information is not intended to replace advice given to you by your health care provider. Make sure you discuss any questions you have with your health care provider. Document Revised: 03/18/2020 Document Reviewed: 01/01/2018 Elsevier Patient Education  2022 [employer] Inc.  

## 2020-11-25 LAB — CBC WITH DIFFERENTIAL/PLATELET
Basophils Absolute: 0.1 10*3/uL (ref 0.0–0.1)
Basophils Relative: 1.1 % (ref 0.0–3.0)
Eosinophils Absolute: 0.3 10*3/uL (ref 0.0–0.7)
Eosinophils Relative: 3.8 % (ref 0.0–5.0)
HCT: 41 % (ref 36.0–46.0)
Hemoglobin: 13.5 g/dL (ref 12.0–15.0)
Lymphocytes Relative: 44.1 % (ref 12.0–46.0)
Lymphs Abs: 3.4 10*3/uL (ref 0.7–4.0)
MCHC: 32.9 g/dL (ref 30.0–36.0)
MCV: 89 fl (ref 78.0–100.0)
Monocytes Absolute: 0.4 10*3/uL (ref 0.1–1.0)
Monocytes Relative: 5.4 % (ref 3.0–12.0)
Neutro Abs: 3.5 10*3/uL (ref 1.4–7.7)
Neutrophils Relative %: 45.6 % (ref 43.0–77.0)
Platelets: 278 10*3/uL (ref 150.0–400.0)
RBC: 4.61 Mil/uL (ref 3.87–5.11)
RDW: 13.6 % (ref 11.5–15.5)
WBC: 7.6 10*3/uL (ref 4.0–10.5)

## 2020-11-25 LAB — COMPREHENSIVE METABOLIC PANEL
ALT: 16 U/L (ref 0–35)
AST: 17 U/L (ref 0–37)
Albumin: 4.5 g/dL (ref 3.5–5.2)
Alkaline Phosphatase: 52 U/L (ref 39–117)
BUN: 15 mg/dL (ref 6–23)
CO2: 30 mEq/L (ref 19–32)
Calcium: 9.8 mg/dL (ref 8.4–10.5)
Chloride: 104 mEq/L (ref 96–112)
Creatinine, Ser: 0.72 mg/dL (ref 0.40–1.20)
GFR: 91.81 mL/min (ref >60.00)
Glucose, Bld: 90 mg/dL (ref 70–99)
Potassium: 4.6 mEq/L (ref 3.5–5.1)
Sodium: 141 mEq/L (ref 135–145)
Total Bilirubin: 0.7 mg/dL (ref 0.2–1.2)
Total Protein: 7 g/dL (ref 6.0–8.3)

## 2020-11-25 LAB — LIPID PANEL
Cholesterol: 226 mg/dL — ABNORMAL HIGH (ref 0–200)
HDL: 78.1 mg/dL (ref >39.00)
LDL Cholesterol: 133 mg/dL — ABNORMAL HIGH (ref 0–99)
NonHDL: 148.21
Total CHOL/HDL Ratio: 3
Triglycerides: 74 mg/dL (ref 0.0–149.0)
VLDL: 14.8 mg/dL (ref 0.0–40.0)

## 2020-11-25 LAB — MICROALBUMIN / CREATININE URINE RATIO
Creatinine,U: 20.9 mg/dL
Microalb Creat Ratio: 3.3 mg/g (ref 0.0–30.0)
Microalb, Ur: <0.7 mg/dL (ref 0.0–1.9)

## 2020-11-25 LAB — HEMOGLOBIN A1C: Hgb A1c MFr Bld: 6.4 % (ref 4.6–6.5)

## 2020-11-25 LAB — TSH: TSH: 1.58 u[IU]/mL (ref 0.35–5.50)

## 2021-04-18 LAB — HM PAP SMEAR

## 2021-04-18 LAB — RESULTS CONSOLE HPV: CHL HPV: NEGATIVE

## 2021-08-23 ENCOUNTER — Other Ambulatory Visit: Payer: Self-pay | Admitting: Family Medicine

## 2021-08-23 DIAGNOSIS — Z1231 Encounter for screening mammogram for malignant neoplasm of breast: Secondary | ICD-10-CM

## 2021-10-17 ENCOUNTER — Ambulatory Visit
Admission: RE | Admit: 2021-10-17 | Discharge: 2021-10-17 | Disposition: A | Discharge status: Home / Self Care | Payer: Managed Care, Other (non HMO) | Source: Ambulatory Visit | Attending: Family Medicine | Admitting: Family Medicine

## 2021-10-17 DIAGNOSIS — Z1231 Encounter for screening mammogram for malignant neoplasm of breast: Secondary | ICD-10-CM

## 2021-11-27 ENCOUNTER — Ambulatory Visit (INDEPENDENT_AMBULATORY_CARE_PROVIDER_SITE_OTHER): Payer: Managed Care, Other (non HMO) | Admitting: Family Medicine

## 2021-11-27 ENCOUNTER — Encounter: Payer: Self-pay | Admitting: Family Medicine

## 2021-11-27 VITALS — BP 134/86 | HR 60 | Temp 98.0°F | Ht 60.75 in | Wt 171.6 lb

## 2021-11-27 DIAGNOSIS — E119 Type 2 diabetes mellitus without complications: Secondary | ICD-10-CM | POA: Diagnosis not present

## 2021-11-27 DIAGNOSIS — E7800 Pure hypercholesterolemia, unspecified: Secondary | ICD-10-CM

## 2021-11-27 DIAGNOSIS — Z0000 Encounter for general adult medical examination without abnormal findings: Secondary | ICD-10-CM

## 2021-11-27 DIAGNOSIS — R030 Elevated blood-pressure reading, without diagnosis of hypertension: Secondary | ICD-10-CM

## 2021-11-27 NOTE — Patient Instructions (Signed)

## 2021-11-27 NOTE — Progress Notes (Signed)
Office Note 11/27/2021  CC:  Chief Complaint  Patient presents with   Annual Exam    Pt is fasting    HPI:  Patient is a 61 y.o. female who is here for annual health maintenance exam and follow-up diabetes, hypercholesterolemia, and elevated blood pressure without diagnosis of hypertension.  Gail Vaughn is feeling very well. She continues to work full-time and enjoys it.  She travels a lot, both for pleasure and for missions trips. She has no acute concerns.  No home glucose monitoring.  She measures her blood pressure fairly often and it is consistently less than 130/80.  Past Medical History:  Diagnosis Date   Abrasion of right ear canal 2021   with cerumen impaction; ENT ended up seeing her and cleared everything out and confirmed NO TM RUPTURE.   Diabetes mellitus without complication ([place])    controlled- no meds-pt lose weight    DVT (deep venous thrombosis) ([place]) 2012   coumadin   GERD (gastroesophageal reflux disease)    History of adenomatous polyp of colon 2014, 2019   Recall 2024 (Dr. Hilarie Fredrickson).   Hypercholesterolemia    mild, pt was on statin for a while but preferred to get off it--she does not want to take meds for this (2021)   Obesity, Class II, BMI 35-39.9     Past Surgical History:  Procedure Laterality Date   CARPAL TUNNEL RELEASE     bilateral   COLONOSCOPY  2014; 05/2017   Recall 5 yrs.   KNEE ARTHROSCOPY     20 years ago   POLYPECTOMY     WISDOM TOOTH EXTRACTION     age 30    Family History  Problem Relation Age of Onset   Lung cancer Mother    Breast cancer Cousin    Esophageal cancer Cousin    Early death Father        motorcycle accident   Multiple sclerosis Sister    Colon cancer Neg Hx    Stomach cancer Neg Hx     Social History   Socioeconomic History   Marital status: Married    Spouse name: Not on file   Number of children: Not on file   Years of education: Not on file   Highest education level: Not on file  Occupational  History   Not on file  Tobacco Use   Smoking status: Former   Smokeless tobacco: Never  Vaping Use   Vaping Use: Never used  Substance and Sexual Activity   Alcohol use: Yes    Comment: occasionally once every 2-3 months per pt   Drug use: No   Sexual activity: Not on file  Other Topics Concern   Not on file  Social History Narrative   Married Ronalee Belts), one son Jenny Reichmann).   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   No T/A/Ds.   Social Determinants of Health   Financial Resource Strain: Not on file  Food Insecurity: Not on file  Transportation Needs: Not on file  Physical Activity: Not on file  Stress: Not on file  Social Connections: Not on file  Intimate Partner Violence: Not on file    Outpatient Medications Prior to Visit  Medication Sig Dispense Refill   Cholecalciferol (VITAMIN D3 PO) Take by mouth daily.     fish oil-omega-3 fatty acids 1000 MG capsule Take 2 g by mouth daily.     OVER THE COUNTER MEDICATION daily. Colloidal minerals- liquid     0.9 %  sodium chloride  infusion      No facility-administered medications prior to visit.    Allergies  Allergen Reactions   Naproxen Sodium Hives    Hives, itching, vomiting   Oxycodone Nausea And Vomiting    ROS Review of Systems  Constitutional:  Negative for appetite change, chills, fatigue and fever.  HENT:  Negative for congestion, dental problem, ear pain and sore throat.   Eyes:  Negative for discharge, redness and visual disturbance.  Respiratory:  Negative for cough, chest tightness, shortness of breath and wheezing.   Cardiovascular:  Negative for chest pain, palpitations and leg swelling.  Gastrointestinal:  Negative for abdominal pain, blood in stool, diarrhea, nausea and vomiting.  Genitourinary:  Negative for difficulty urinating, dysuria, flank pain, frequency, hematuria and urgency.  Musculoskeletal:  Negative for arthralgias, back pain, joint swelling, myalgias and neck stiffness.  Skin:   Negative for pallor and rash.  Neurological:  Negative for dizziness, speech difficulty, weakness and headaches.  Hematological:  Negative for adenopathy. Does not bruise/bleed easily.  Psychiatric/Behavioral:  Negative for confusion and sleep disturbance. The patient is not nervous/anxious.     PE;    11/27/2021    2:53 PM 11/24/2020    2:55 PM 11/02/2019    3:29 PM  Vitals with BMI  Height 5' 0.75" '5\' 0"'$  5' 1.024"  Weight 171 lbs 10 oz 174 lbs 3 oz 171 lbs  BMI 32.69 57.84 69.62  Systolic 952 841 324  Diastolic 86 84 82  Pulse 60 58 69  Exam chaperoned by Deveron Furlong, CMA.  Gen: Alert, well appearing.  Patient is oriented to person, place, time, and situation. AFFECT: pleasant, lucid thought and speech. ENT: Ears: EACs clear, normal epithelium.  TMs with good light reflex and landmarks bilaterally.  Eyes: no injection, icteris, swelling, or exudate.  EOMI, PERRLA. Nose: no drainage or turbinate edema/swelling.  No injection or focal lesion.  Mouth: lips without lesion/swelling.  Oral mucosa pink and moist.  Dentition intact and without obvious caries or gingival swelling.  Oropharynx without erythema, exudate, or swelling.  Neck: supple/nontender.  No LAD, mass, or TM.  Carotid pulses 2+ bilaterally, without bruits. CV: RRR, no m/r/g.   LUNGS: CTA bilat, nonlabored resps, good aeration in all lung fields. ABD: soft, NT, ND, BS normal.  No hepatospenomegaly or mass.  No bruits. EXT: no clubbing, cyanosis, or edema.  Musculoskeletal: no joint swelling, erythema, warmth, or tenderness.  ROM of all joints intact. Skin - no sores or suspicious lesions or rashes or color changes Foot exam - no swelling, tenderness or skin or vascular lesions. Color and temperature is normal. Sensation is intact. Peripheral pulses are palpable. Toenails are normal.  Pertinent labs:  Lab Results  Component Value Date   TSH 1.58 11/24/2020   Lab Results  Component Value Date   WBC 7.6 11/24/2020    HGB 13.5 11/24/2020   HCT 41.0 11/24/2020   MCV 89.0 11/24/2020   PLT 278.0 11/24/2020   Lab Results  Component Value Date   CREATININE 0.72 11/24/2020   BUN 15 11/24/2020   NA 141 11/24/2020   K 4.6 11/24/2020   CL 104 11/24/2020   CO2 30 11/24/2020   Lab Results  Component Value Date   ALT 16 11/24/2020   AST 17 11/24/2020   ALKPHOS 52 11/24/2020   BILITOT 0.7 11/24/2020   Lab Results  Component Value Date   CHOL 226 (H) 11/24/2020   Lab Results  Component Value Date   HDL 78.10  11/24/2020   Lab Results  Component Value Date   LDLCALC 133 (H) 11/24/2020   Lab Results  Component Value Date   TRIG 74.0 11/24/2020   Lab Results  Component Value Date   CHOLHDL 3 11/24/2020   Lab Results  Component Value Date   HGBA1C 6.4 11/24/2020   ASSESSMENT AND PLAN:   #1 health maintenance exam: Reviewed age and gender appropriate health maintenance issues (prudent diet, regular exercise, health risks of tobacco and excessive alcohol, use of seatbelts, fire alarms in home, use of sunscreen).  Also reviewed age and gender appropriate health screening as well as vaccine recommendations. Vaccines: shingrix declined.  Flu declined.  Prevnar 20 declined. LABS: fasting HP and Hba1c today. Cervical ca screening: 03/2019--she is utd with GYN annual exams. Breast ca screening: normal mammogram 09/2021. Colon ca screening:  05/2017->recall 2024.  #2 diabetes without complication.  Diet- only. Hemoglobin A1c maxed out at 7.2% a few years ago. Most recent hemoglobin A1c 1 year ago was 6.4%. Recheck hemoglobin A1c and fasting glucose today. Feet exam normal today. Urine microalbumin/creatinine today.  #3 hyperlipidemia, patien intolerant of 1 statin in the past and declines further medication trials. Lipid panel and hepatic panel today.  #4 elevated blood pressure without diagnosis of hypertension. Continue to monitor home blood pressures and call or return if consistently  greater than 130/80.  An After Visit Summary was printed and given to the patient.  FOLLOW UP:  Return in about 1 year (around 11/28/2022) for annual CPE (fasting).  Signed:  Crissie Sickles, MD           11/27/2021

## 2021-11-28 LAB — COMPREHENSIVE METABOLIC PANEL
ALT: 12 U/L (ref 0–35)
AST: 12 U/L (ref 0–37)
Albumin: 4.5 g/dL (ref 3.5–5.2)
Alkaline Phosphatase: 49 U/L (ref 39–117)
BUN: 14 mg/dL (ref 6–23)
CO2: 30 mEq/L (ref 19–32)
Calcium: 9.7 mg/dL (ref 8.4–10.5)
Chloride: 102 mEq/L (ref 96–112)
Creatinine, Ser: 0.66 mg/dL (ref 0.40–1.20)
GFR: 95.64 mL/min (ref >60.00)
Glucose, Bld: 89 mg/dL (ref 70–99)
Potassium: 4.3 mEq/L (ref 3.5–5.1)
Sodium: 139 mEq/L (ref 135–145)
Total Bilirubin: 0.7 mg/dL (ref 0.2–1.2)
Total Protein: 6.8 g/dL (ref 6.0–8.3)

## 2021-11-28 LAB — LIPID PANEL
Cholesterol: 212 mg/dL — ABNORMAL HIGH (ref 0–200)
HDL: 66.8 mg/dL (ref >39.00)
LDL Cholesterol: 125 mg/dL — ABNORMAL HIGH (ref 0–99)
NonHDL: 145.47
Total CHOL/HDL Ratio: 3
Triglycerides: 102 mg/dL (ref 0.0–149.0)
VLDL: 20.4 mg/dL (ref 0.0–40.0)

## 2021-11-28 LAB — CBC
HCT: 41.3 % (ref 36.0–46.0)
Hemoglobin: 13.7 g/dL (ref 12.0–15.0)
MCHC: 33.1 g/dL (ref 30.0–36.0)
MCV: 88.8 fl (ref 78.0–100.0)
Platelets: 283 10*3/uL (ref 150.0–400.0)
RBC: 4.65 Mil/uL (ref 3.87–5.11)
RDW: 13.3 % (ref 11.5–15.5)
WBC: 6.7 10*3/uL (ref 4.0–10.5)

## 2021-11-28 LAB — MICROALBUMIN / CREATININE URINE RATIO
Creatinine,U: 23.3 mg/dL
Microalb Creat Ratio: 3 mg/g (ref 0.0–30.0)
Microalb, Ur: <0.7 mg/dL (ref 0.0–1.9)

## 2021-11-28 LAB — TSH: TSH: 1.25 u[IU]/mL (ref 0.35–5.50)

## 2021-11-28 LAB — HEMOGLOBIN A1C: Hgb A1c MFr Bld: 6.2 % (ref 4.6–6.5)

## 2021-12-11 ENCOUNTER — Emergency Department (HOSPITAL_COMMUNITY)
Admission: EM | Admit: 2021-12-11 | Discharge: 2021-12-11 | Disposition: A | Discharge status: Home / Self Care | Payer: Managed Care, Other (non HMO) | Attending: Emergency Medicine | Admitting: Emergency Medicine

## 2021-12-11 ENCOUNTER — Other Ambulatory Visit: Payer: Self-pay

## 2021-12-11 ENCOUNTER — Emergency Department (HOSPITAL_COMMUNITY): Payer: Managed Care, Other (non HMO)

## 2021-12-11 DIAGNOSIS — K8020 Calculus of gallbladder without cholecystitis without obstruction: Secondary | ICD-10-CM | POA: Diagnosis not present

## 2021-12-11 DIAGNOSIS — R109 Unspecified abdominal pain: Secondary | ICD-10-CM | POA: Diagnosis present

## 2021-12-11 DIAGNOSIS — D72829 Elevated white blood cell count, unspecified: Secondary | ICD-10-CM | POA: Insufficient documentation

## 2021-12-11 LAB — CBC WITH DIFFERENTIAL/PLATELET
Abs Immature Granulocytes: 0.02 10*3/uL (ref 0.00–0.07)
Basophils Absolute: 0 10*3/uL (ref 0.0–0.1)
Basophils Relative: 0 %
Eosinophils Absolute: 0 10*3/uL (ref 0.0–0.5)
Eosinophils Relative: 0 %
HCT: 44.2 % (ref 36.0–46.0)
Hemoglobin: 14.4 g/dL (ref 12.0–15.0)
Immature Granulocytes: 0 %
Lymphocytes Relative: 13 %
Lymphs Abs: 1.4 10*3/uL (ref 0.7–4.0)
MCH: 29.6 pg (ref 26.0–34.0)
MCHC: 32.6 g/dL (ref 30.0–36.0)
MCV: 90.8 fL (ref 80.0–100.0)
Monocytes Absolute: 0.2 10*3/uL (ref 0.1–1.0)
Monocytes Relative: 2 %
Neutro Abs: 9 10*3/uL — ABNORMAL HIGH (ref 1.7–7.7)
Neutrophils Relative %: 85 %
Platelets: 242 10*3/uL (ref 150–400)
RBC: 4.87 MIL/uL (ref 3.87–5.11)
RDW: 12.6 % (ref 11.5–15.5)
WBC: 10.6 10*3/uL — ABNORMAL HIGH (ref 4.0–10.5)
nRBC: 0 % (ref 0.0–0.2)

## 2021-12-11 LAB — I-STAT BETA HCG BLOOD, ED (MC, WL, AP ONLY): I-stat hCG, quantitative: <5 m[IU]/mL (ref <5)

## 2021-12-11 LAB — COMPREHENSIVE METABOLIC PANEL
ALT: 18 U/L (ref 0–44)
AST: 17 U/L (ref 15–41)
Albumin: 4.6 g/dL (ref 3.5–5.0)
Alkaline Phosphatase: 50 U/L (ref 38–126)
Anion gap: 11 (ref 5–15)
BUN: 26 mg/dL — ABNORMAL HIGH (ref 6–20)
CO2: 26 mmol/L (ref 22–32)
Calcium: 9.6 mg/dL (ref 8.9–10.3)
Chloride: 103 mmol/L (ref 98–111)
Creatinine, Ser: 0.7 mg/dL (ref 0.44–1.00)
GFR, Estimated: >60 mL/min (ref >60)
Glucose, Bld: 205 mg/dL — ABNORMAL HIGH (ref 70–99)
Potassium: 3.8 mmol/L (ref 3.5–5.1)
Sodium: 140 mmol/L (ref 135–145)
Total Bilirubin: 0.8 mg/dL (ref 0.3–1.2)
Total Protein: 8 g/dL (ref 6.5–8.1)

## 2021-12-11 LAB — LIPASE, BLOOD: Lipase: 27 U/L (ref 11–51)

## 2021-12-11 MED ORDER — HYDROMORPHONE HCL 1 MG/ML IJ SOLN
0.5000 mg | Freq: Once | INTRAMUSCULAR | Status: AC
  Administered 2021-12-11: 0.5 mg via INTRAVENOUS
  Filled 2021-12-11: qty 1

## 2021-12-11 MED ORDER — ONDANSETRON 4 MG PO TBDP: 4.0000 mg | ORAL_TABLET | Freq: Three times a day (TID) | ORAL | 0 refills | Status: DC | PRN

## 2021-12-11 MED ORDER — LACTATED RINGERS IV BOLUS
1000.0000 mL | Freq: Once | INTRAVENOUS | Status: AC
  Administered 2021-12-11: 1000 mL via INTRAVENOUS

## 2021-12-11 MED ORDER — HYDROCODONE-ACETAMINOPHEN 5-325 MG PO TABS
1.0000 | ORAL_TABLET | Freq: Once | ORAL | Status: AC
  Administered 2021-12-11: 1 via ORAL
  Filled 2021-12-11: qty 1

## 2021-12-11 MED ORDER — HYDROCODONE-ACETAMINOPHEN 5-325 MG PO TABS: 1.0000 | ORAL_TABLET | Freq: Four times a day (QID) | ORAL | 0 refills | Status: DC | PRN

## 2021-12-11 MED ORDER — ONDANSETRON HCL 4 MG/2ML IJ SOLN
4.0000 mg | Freq: Once | INTRAMUSCULAR | Status: AC
  Administered 2021-12-11: 4 mg via INTRAVENOUS
  Filled 2021-12-11: qty 2

## 2021-12-11 NOTE — ED Notes (Signed)
US at bedside

## 2021-12-11 NOTE — ED Notes (Signed)
ED Provider at bedside. 

## 2021-12-11 NOTE — ED Triage Notes (Signed)
Pt reports around 2100, onset of a fullness feeling in her upper abdominal area, cramping pain accompanied by vomiting. 1 loose stool today. Denies fevers.

## 2021-12-11 NOTE — Discharge Instructions (Addendum)
Call [facility] and schedule an appointment.  Let them know you have gallstones and need an evaluation.  Take medications as prescribed. If symptoms change or worsen, please return to the ER.

## 2021-12-11 NOTE — ED Provider Notes (Signed)
[facility] Emergency Department Provider Note MRN:  [id number]  Arrival date & time: 12/11/21     Chief Complaint   Abdominal Pain   History of Present Illness   Gail Vaughn is a 61 y.o. year-old female presents to the ED with chief complaint of abdominal pain with associated nausea and vomiting.  Onset about 4-6 hours ago.  She reports green vomit.  Denies hematemesis.  Denies fevers or chills.  Denies sick contacts.  No successful treatments PTA.  Denies prior abdominal surgeries.  History provided by patient.   Review of Systems  Pertinent positive and negative review of systems noted in HPI.    Physical Exam   Vitals:   12/11/21 0430 12/11/21 0500  BP: (!) 208/96 (!) 205/97  Pulse: (!) 59 69  Resp: 17 17  Temp:    SpO2: 90% 95%    CONSTITUTIONAL:  uncomfortable-appearing, NAD NEURO:  Alert and oriented x 3, CN 3-12 grossly intact EYES:  eyes equal and reactive ENT/NECK:  Supple, no stridor  CARDIO:  mild bradycardia, regular rhythm, appears well-perfused  PULM:  No respiratory distress,  GI/GU:  non-distended,  MSK/SPINE:  No gross deformities, no edema, moves all extremities  SKIN:  no rash, atraumatic   *Additional and/or pertinent findings included in MDM below  Diagnostic and Interventional Summary    EKG Interpretation  Date/Time:    Ventricular Rate:    PR Interval:    QRS Duration:   QT Interval:    QTC Calculation:   R Axis:     Text Interpretation:         Labs Reviewed  COMPREHENSIVE METABOLIC PANEL - Abnormal; Notable for the following components:      Result Value   Glucose, Bld 205 (*)    BUN 26 (*)    All other components within normal limits  CBC WITH DIFFERENTIAL/PLATELET - Abnormal; Notable for the following components:   WBC 10.6 (*)    Neutro Abs 9.0 (*)    All other components within normal limits  LIPASE, BLOOD  URINALYSIS, ROUTINE W REFLEX MICROSCOPIC  I-STAT BETA HCG BLOOD, ED ([facility], [facility],  [facility] ONLY)    US Abdomen Limited  Final Result      Medications  ondansetron (ZOFRAN) injection 4 mg (4 mg Intravenous Given 12/11/21 0321)  HYDROmorphone (DILAUDID) injection 0.5 mg (0.5 mg Intravenous Given 12/11/21 0319)  lactated ringers bolus 1,000 mL (0 mLs Intravenous Stopped 12/11/21 0519)  HYDROcodone-acetaminophen (NORCO/VICODIN) 5-325 MG per tablet 1 tablet (1 tablet Oral Given 12/11/21 0414)     Procedures  /  Critical Care Procedures  ED Course and Medical Decision Making  I have reviewed the triage vital signs, the nursing notes, and pertinent available records from the EMR.  Social Determinants Affecting Complexity of Care: Patient has no clinically significant social determinants affecting this chief complaint..   ED Course:    Medical Decision Making Patient here with right sided and epigastric abdominal pain.  Onset 6 hours PTA.  Has had some "green" vomit.  Afebrile.  Will check labs and RUQ [place].    Pain treated with IV diluadid.  Not vomiting in ED.    Multiple rechecks on pain.  Feeling improved now.  Feels well enough for discharge.  Will have patient follow-up with gen surg.  Return precautions discussed.  Amount and/or Complexity of Data Reviewed Labs: ordered.    Details: Mild leukocytosis, normal LFTs and t bili. Radiology: ordered and independent interpretation performed.  Details: My interpretation of the [place] is: Gallstones present, no wall thickening  Risk Prescription drug management.     Consultants: No consultations were needed in caring for this patient.   Treatment and Plan: I considered admission due to patient's initial presentation, but after considering the examination and diagnostic results, patient will not require admission and can be discharged with outpatient follow-up.    Final Clinical Impressions(s) / ED Diagnoses     ICD-10-CM   1. Calculus of gallbladder without cholecystitis without obstruction  K80.20        ED Discharge Orders          Ordered    HYDROcodone-acetaminophen (NORCO/VICODIN) 5-325 MG tablet  Every 6 hours PRN        12/11/21 0516    ondansetron (ZOFRAN-ODT) 4 MG disintegrating tablet  Every 8 hours PRN        12/11/21 0516              Discharge Instructions Discussed with and Provided to Patient:     Discharge Instructions      Call [facility] and schedule an appointment.  Let them know you have gallstones and need an evaluation.  Take medications as prescribed. If symptoms change or worsen, please return to the ER.       Montine Circle, PA-C 12/11/21 Noel Gerold, MD 12/12/21 [phone]

## 2022-01-08 ENCOUNTER — Other Ambulatory Visit: Payer: Self-pay | Admitting: Surgery

## 2022-01-23 ENCOUNTER — Other Ambulatory Visit: Payer: Self-pay | Admitting: Surgery

## 2022-02-02 ENCOUNTER — Telehealth: Payer: Self-pay | Admitting: Family Medicine

## 2022-02-02 NOTE — Telephone Encounter (Signed)
Spoke with patient and informed of message below. She still does not understand why she had a co-pay for an annual physical and does not recall having to pay anything during 11/24/20 physical. Patient was informed I could not confirm or deny this because I do not handle co-pays or billing. She would like to further discuss this with office manager and request a call. She was made aware manager is not currently in the office but she will follow up at her earliest convenience.

## 2022-02-02 NOTE — Telephone Encounter (Signed)
Called patient and [facility] informing patient I will reach out again on Monday to address billing questions.

## 2022-02-02 NOTE — Telephone Encounter (Signed)
Pt called about her bill and says she has spoken with the billing department and they are standing by the charge of her co-pay for a annual physical. She states she knows not to ask questions during the physical because of additional charges. She reports that Dr. Anitra Lauth asked if he could "test her feet" and she says he has done this in the past and never received a charge, so she is unsure if this is what her co-pay charge is for.  This is from 11/27/21 visit. I could not look too much into the concern die to patients in front of me at the time of call.

## 2022-02-08 NOTE — Telephone Encounter (Signed)
Spoke to patient and informed that her recent visit on 11/27/2021 was for her Physical and follow up on her chronic condition and that the copay being requested is for the Chronic Care/ Office Visit. Advised patient of the billing department whom additional question and concerns can be inquired with related to billing and she stated she spoke to them and was told charges were accurate and to cal the office. I have informed patient that I would reach out to billing department to see if they can further assist her. Providers documentation does support charges and this has been relayed to patient by billing as well per documentation.

## 2022-02-08 NOTE — Telephone Encounter (Signed)
Patient called and asked to speak with [facility].  I told her that Delfino Lovett was working remotely today and I could send a message to her; Delfino Lovett could call you back.  She said "I left a message yesterday, and was told she would call me back, and I have not heard from her yet."  I apologized to her, and told patient I would send a message to [facility] again and request that she please return call today. Patient was content with decision.  Please call Kieth Brightly at [phone]

## 2022-02-15 ENCOUNTER — Other Ambulatory Visit: Payer: Self-pay | Admitting: Surgery

## 2022-02-15 HISTORY — PX: CHOLECYSTECTOMY: SHX55

## 2022-02-27 ENCOUNTER — Telehealth: Payer: Self-pay | Admitting: Family Medicine

## 2022-02-27 NOTE — Telephone Encounter (Signed)
Patient called and asked to speak with Dr. Anitra Lauth. I informed her that he was gone for the day. She states that its regarding a bill for her visit on 11/27/21 which was for a physical. I explained to her in the past about this bill and I think it was something about her feet being tested and how she has never been charged in the past for this same service. She says no one in this office can answer her questions and keeps getting put off by people she has had no success with the billing department.

## 2022-03-02 NOTE — Telephone Encounter (Signed)
Patient called back regarding a bill.  She states that she has spoke to 5 different people and no one can help her. She also requested itemized bill, I told her she would have to call billing to get printed. I also advised her to speak to supervisor or manager if she is unable to get answers for bill.

## 2022-04-13 NOTE — Telephone Encounter (Signed)
Gail Vaughn ([employer]) calling in regards to a bill - DOS 11/27/21.  Gave her our billing dept's # at [phone].   Gail called back and spoke to billing.  Patient can appeal billing.  Gail giving [place] a heads up in case patient calls back.  She may ask for office notes from 11/27/21 and her 2022 CPE office visit notes also - DOS 11/24/20 to compare documentation.   Gail can be reached at [phone].   Reference # [id number]

## 2022-05-01 NOTE — Telephone Encounter (Signed)
Patient called to say issue with billing/coding is still unresolved.  No one in billing can answer her questions, no one will return her calls. With that being said, because of no response/no help with trying to get issue resolved, she reported to state insurance fraud dept. She stated she was reporting Dr. Milinda Cave for reporting services that he did not perform (11/27/2021). She requested that I cancel all future appts with Dr. Milinda Cave as she would have to find a new provider. She apologized and said she hated this, but she had no other choice. I cancelled her physical scheduled in Novemeber 2024.

## 2022-05-01 NOTE — Telephone Encounter (Signed)
Received a call from [employer] regarding bill on patient Gail Vaughn.  I gave [employer] representative billing's number [phone].  She said thank you and call ended.

## 2022-05-08 ENCOUNTER — Encounter: Payer: Self-pay | Admitting: Internal Medicine

## 2022-06-05 ENCOUNTER — Ambulatory Visit (AMBULATORY_SURGERY_CENTER): Payer: Managed Care, Other (non HMO) | Admitting: *Deleted

## 2022-06-05 VITALS — Ht 61.0 in | Wt 175.0 lb

## 2022-06-05 DIAGNOSIS — Z86010 Personal history of colonic polyps: Secondary | ICD-10-CM

## 2022-06-05 MED ORDER — NA SULFATE-K SULFATE-MG SULF 17.5-3.13-1.6 GM/177ML PO SOLN: 1.0000 | Freq: Once | ORAL | 0 refills | Status: AC

## 2022-06-05 NOTE — Progress Notes (Signed)
Pt's name and DOB verified at the beginning of the pre-visit.  Pt denies any difficulty with ambulating,sitting, laying down or rolling side to side Gave both [facility] main # and MD on call # prior to instructions.  No egg or soy allergy known to patient  No issues known to pt with past sedation with any surgeries or procedures Pt denies having issues being intubated Pt has no issues moving head neck or swallowing No FH of Malignant Hyperthermia Pt is not on diet pills Pt is not on home 02  Pt is not on blood thinners  Pt denies issues with constipation  Pt is not on dialysis Pt denise any abnormal heart rhythms  Pt denies any upcoming cardiac testing Pt encouraged to use to use Singlecare or Goodrx to reduce cost  Patient's chart reviewed by Gail Vaughn CNRA prior to pre-visit and patient appropriate for the [facility].  Pre-visit completed and red dot placed by patient's name on their procedure day (on provider's schedule).  . Visit by phone Pt states weight is 175 lb Instructed pt why it is important to and  to call if they have any changes in health or new medications. Directed them to the # given and on instructions.   Pt states they will.  Instructions reviewed with pt and pt states understanding. Instructed to review again prior to procedure. Pt states they will.  Instructions sent by mail with coupon and by my chart

## 2022-06-25 ENCOUNTER — Encounter: Payer: Self-pay | Admitting: Internal Medicine

## 2022-07-04 ENCOUNTER — Ambulatory Visit (AMBULATORY_SURGERY_CENTER): Payer: Managed Care, Other (non HMO) | Admitting: Internal Medicine

## 2022-07-04 ENCOUNTER — Encounter: Payer: Self-pay | Admitting: Internal Medicine

## 2022-07-04 VITALS — BP 126/72 | HR 57 | Temp 99.1°F | Resp 11 | Ht 61.0 in | Wt 175.0 lb

## 2022-07-04 DIAGNOSIS — D123 Benign neoplasm of transverse colon: Secondary | ICD-10-CM | POA: Diagnosis not present

## 2022-07-04 DIAGNOSIS — Z09 Encounter for follow-up examination after completed treatment for conditions other than malignant neoplasm: Secondary | ICD-10-CM | POA: Diagnosis present

## 2022-07-04 DIAGNOSIS — Z86010 Personal history of colonic polyps: Secondary | ICD-10-CM

## 2022-07-04 DIAGNOSIS — D125 Benign neoplasm of sigmoid colon: Secondary | ICD-10-CM

## 2022-07-04 DIAGNOSIS — K635 Polyp of colon: Secondary | ICD-10-CM

## 2022-07-04 MED ORDER — SODIUM CHLORIDE 0.9 % IV SOLN
500.0000 mL | Freq: Once | INTRAVENOUS | Status: DC
Start: 2022-07-04 — End: 2022-07-04

## 2022-07-04 NOTE — Op Note (Signed)
[facility] Patient Name: Gail Vaughn Procedure Date: 07/04/2022 11:26 AM MRN: [id number] Endoscopist: Beverley Fiedler , MD, [id number] Age: 62 Referring MD:  Date of Birth: [birth year] Gender: Female Account #: [id number] Procedure:                Colonoscopy Indications:              High risk colon cancer surveillance: Personal                            history of sessile serrated polyps and adenomas,                            Last colonoscopy: May 2019 (1 adenoma); June 2014                            (adenoma and SSP) Medicines:                Monitored Anesthesia Care Procedure:                Pre-Anesthesia Assessment:                           - Prior to the procedure, a History and Physical                            was performed, and patient medications and                            allergies were reviewed. The patient's tolerance of                            previous anesthesia was also reviewed. The risks                            and benefits of the procedure and the sedation                            options and risks were discussed with the patient.                            All questions were answered, and informed consent                            was obtained. Prior Anticoagulants: The patient has                            taken no anticoagulant or antiplatelet agents. ASA                            Grade Assessment: II - A patient with mild systemic                            disease. After reviewing the risks and benefits,  the patient was deemed in satisfactory condition to                            undergo the procedure.                           After obtaining informed consent, the colonoscope                            was passed under direct vision. Throughout the                            procedure, the patient's blood pressure, pulse, and                            oxygen saturations were monitored  continuously. The                            [id number]-HQ190L Colonoscope [id number] was introduced                            through the anus and advanced to the cecum,                            identified by appendiceal orifice and ileocecal                            valve. The colonoscopy was performed without                            difficulty. The patient tolerated the procedure                            well. The quality of the bowel preparation was                            good. The ileocecal valve, appendiceal orifice, and                            rectum were photographed. Scope In: 11:33:50 AM Scope Out: 11:47:31 AM Scope Withdrawal Time: 0 hours 12 minutes 24 seconds  Total Procedure Duration: 0 hours 13 minutes 41 seconds  Findings:                 The digital rectal exam was normal.                           Two sessile polyps were found in the transverse                            colon. The polyps were 2 to 3 mm in size. These                            polyps were removed with a cold snare. Resection  and retrieval were complete.                           A 5 mm polyp was found in the sigmoid colon. The                            polyp was sessile. The polyp was removed with a                            cold snare. Resection and retrieval were complete.                           A few small-mouthed diverticula were found in the                            sigmoid colon.                           Internal hemorrhoids were found during                            retroflexion. The hemorrhoids were small. Complications:            No immediate complications. Estimated Blood Loss:     Estimated blood loss: none. Impression:               - Two 2 to 3 mm polyps in the transverse colon,                            removed with a cold snare. Resected and retrieved.                           - One 5 mm polyp in the sigmoid colon, removed with                             a cold snare. Resected and retrieved.                           - Mild diverticulosis in the sigmoid colon.                           - Small internal hemorrhoids. Recommendation:           - Patient has a contact number available for                            emergencies. The signs and symptoms of potential                            delayed complications were discussed with the                            patient. Return to normal activities tomorrow.  Written discharge instructions were provided to the                            patient.                           - Resume previous diet.                           - Continue present medications.                           - Await pathology results.                           - Repeat colonoscopy is recommended for                            surveillance. The colonoscopy date will be                            determined after pathology results from today's                            exam become available for review. Beverley Fiedler, MD 07/04/2022 11:51:17 AM This report has been signed electronically.

## 2022-07-04 NOTE — Progress Notes (Signed)
Pt's states no medical or surgical changes since previsit or office visit. 

## 2022-07-04 NOTE — Patient Instructions (Signed)
 **  Handouts given on polyps and hemorrhoids**  YOU HAD AN ENDOSCOPIC PROCEDURE TODAY AT THE [facility]:   Refer to the procedure report that was given to you for any specific questions about what was found during the examination.  If the procedure report does not answer your questions, please call your gastroenterologist to clarify.  If you requested that your care partner not be given the details of your procedure findings, then the procedure report has been included in a sealed envelope for you to review at your convenience later.  YOU SHOULD EXPECT: Some feelings of bloating in the abdomen. Passage of more gas than usual.  Walking can help get rid of the air that was put into your GI tract during the procedure and reduce the bloating. If you had a lower endoscopy (such as a colonoscopy or flexible sigmoidoscopy) you may notice spotting of blood in your stool or on the toilet paper. If you underwent a bowel prep for your procedure, you may not have a normal bowel movement for a few days.  Please Note:  You might notice some irritation and congestion in your nose or some drainage.  This is from the oxygen used during your procedure.  There is no need for concern and it should clear up in a day or so.  SYMPTOMS TO REPORT IMMEDIATELY:  Following lower endoscopy (colonoscopy or flexible sigmoidoscopy):  Excessive amounts of blood in the stool  Significant tenderness or worsening of abdominal pains  Swelling of the abdomen that is new, acute  Fever of 100F or higher  For urgent or emergent issues, a gastroenterologist can be reached at any hour by calling [phone]. Do not use MyChart messaging for urgent concerns.    DIET:  We do recommend a small meal at first, but then you may proceed to your regular diet.  Drink plenty of fluids but you should avoid alcoholic beverages for 24 hours.  ACTIVITY:  You should plan to take it easy for the rest of today and you should NOT  DRIVE or use heavy machinery until tomorrow (because of the sedation medicines used during the test).    FOLLOW UP: Our staff will call the number listed on your records the next business day following your procedure.  We will call around 7:15- 8:00 am to check on you and address any questions or concerns that you may have regarding the information given to you following your procedure. If we do not reach you, we will leave a message.     If any biopsies were taken you will be contacted by phone or by letter within the next 1-3 weeks.  Please call us at [phone] if you have not heard about the biopsies in 3 weeks.    SIGNATURES/CONFIDENTIALITY: You and/or your care partner have signed paperwork which will be entered into your electronic medical record.  These signatures attest to the fact that that the information above on your After Visit Summary has been reviewed and is understood.  Full responsibility of the confidentiality of this discharge information lies with you and/or your care-partner. 

## 2022-07-04 NOTE — Progress Notes (Signed)
GASTROENTEROLOGY PROCEDURE H&P NOTE   Primary Care Physician: Jeoffrey Massed, MD    Reason for Procedure:  Personal history of colon polyps  Plan:    Colonoscopy  Patient is appropriate for endoscopic procedure(s) in the ambulatory ([facility]) setting.  The nature of the procedure, as well as the risks, benefits, and alternatives were carefully and thoroughly reviewed with the patient. Ample time for discussion and questions allowed. The patient understood, was satisfied, and agreed to proceed.     HPI: Gail Vaughn is a 62 y.o. female who presents for surveillance colonoscopy.  Medical history as below.  Tolerated the prep.  No recent chest pain or shortness of breath.  No abdominal pain today.  Past Medical History:  Diagnosis Date   Abrasion of right ear canal 2021   with cerumen impaction; ENT ended up seeing her and cleared everything out and confirmed NO TM RUPTURE.   Diabetes mellitus without complication (HCC)    controlled- no meds-pt lose weight    DVT (deep venous thrombosis) (HCC) 2012   coumadin   GERD (gastroesophageal reflux disease)    History of adenomatous polyp of colon 2014, 2019   Recall 2024 (Dr. Rhea Belton).   Hypercholesterolemia    mild, pt was on statin for a while but preferred to get off it--she does not want to take meds for this (2021)   Obesity, Class II, BMI 35-39.9     Past Surgical History:  Procedure Laterality Date   CARPAL TUNNEL RELEASE     bilateral   CHOLECYSTECTOMY  02/15/2022   COLONOSCOPY  2014; 05/2017   Recall 5 yrs.   KNEE ARTHROSCOPY     20 years ago   POLYPECTOMY     WISDOM TOOTH EXTRACTION     age 34    Prior to Admission medications   Medication Sig Start Date End Date Taking? Authorizing Provider  BILE SALTS-CAPS-CASC-PHENOLPTH PO Take by mouth.   Yes [provider]  Cholecalciferol (VITAMIN D3 PO) Take by mouth daily.    [provider]  fish oil-omega-3 fatty acids 1000 MG capsule Take 2  g by mouth daily.    [provider]  OVER THE COUNTER MEDICATION daily. Colloidal minerals- liquid Patient not taking: Reported on 06/05/2022    [provider]    Current Outpatient Medications  Medication Sig Dispense Refill   BILE SALTS-CAPS-CASC-PHENOLPTH PO Take by mouth.     Cholecalciferol (VITAMIN D3 PO) Take by mouth daily.     fish oil-omega-3 fatty acids 1000 MG capsule Take 2 g by mouth daily.     OVER THE COUNTER MEDICATION daily. Colloidal minerals- liquid (Patient not taking: Reported on 06/05/2022)     Current Facility-Administered Medications  Medication Dose Route Frequency Provider Last Rate Last Admin   0.9 %  sodium chloride infusion  500 mL Intravenous Once Micah Barnier, Carie Caddy, MD        Allergies as of 07/04/2022 - Review Complete 07/04/2022  Allergen Reaction Noted   Naproxen sodium Hives 06/26/2012    Family History  Problem Relation Age of Onset   Lung cancer Mother    Early death Father        motorcycle accident   Multiple sclerosis Sister    Breast cancer Cousin    Esophageal cancer Cousin    Colon cancer Neg Hx    Stomach cancer Neg Hx    Colon polyps Neg Hx    Rectal cancer Neg Hx  Social History   Socioeconomic History   Marital status: Married    Spouse name: Not on file   Number of children: Not on file   Years of education: Not on file   Highest education level: Not on file  Occupational History   Not on file  Tobacco Use   Smoking status: Former   Smokeless tobacco: Never  Vaping Use   Vaping Use: Never used  Substance and Sexual Activity   Alcohol use: Yes    Comment: occasionally once every 2-3 months per pt   Drug use: No   Sexual activity: Not on file  Other Topics Concern   Not on file  Social History Narrative   Married Gail Vaughn November), one son Gail Vaughn).   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   No T/A/Ds.   Social Determinants of Health   Financial Resource Strain: Not on file  Food Insecurity:  Not on file  Transportation Needs: Not on file  Physical Activity: Not on file  Stress: Not on file  Social Connections: Not on file  Intimate Partner Violence: Not on file    Physical Exam: Vital signs in last 24 hours: @BP  132/79   Pulse 60   Temp 99.1 F (37.3 C)   Ht 5\' 1"  (1.549 m)   Wt 175 lb (79.4 kg)   LMP 10/23/2011   SpO2 97%   BMI 33.07 kg/m  GEN: NAD EYE: Sclerae anicteric ENT: MMM CV: Non-tachycardic Pulm: CTA b/l GI: Soft, NT/ND NEURO:  Alert & Oriented x 3   Erick Blinks, MD [facility]  07/04/2022 11:24 AM

## 2022-07-04 NOTE — Progress Notes (Signed)
Called to room to assist during endoscopic procedure.  Patient ID and intended procedure confirmed with present staff. Received instructions for my participation in the procedure from the performing physician.  

## 2022-07-05 ENCOUNTER — Telehealth: Payer: Self-pay | Admitting: *Deleted

## 2022-07-05 NOTE — Telephone Encounter (Signed)
  Follow up Call-     07/04/2022   10:47 AM  Call back number  Post procedure Call Back phone  # [phone]  Permission to leave phone message Yes     Patient questions:  Do you have a fever, pain , or abdominal swelling? No. Pain Score  0 *  Have you tolerated food without any problems? Yes.    Have you been able to return to your normal activities? Yes.    Do you have any questions about your discharge instructions: Diet   No. Medications  No. Follow up visit  No.  Do you have questions or concerns about your Care? No.  Actions: * If pain score is 4 or above: No action needed, pain <4.

## 2022-07-11 ENCOUNTER — Encounter: Payer: Self-pay | Admitting: Internal Medicine

## 2022-10-08 ENCOUNTER — Encounter: Payer: Self-pay | Admitting: Family Medicine

## 2022-10-08 DIAGNOSIS — Z0000 Encounter for general adult medical examination without abnormal findings: Secondary | ICD-10-CM

## 2022-11-30 ENCOUNTER — Encounter: Payer: Managed Care, Other (non HMO) | Admitting: Family Medicine
